# Patient Record
Sex: Female | Born: 1965 | Race: White | Hispanic: No | Marital: Married | Smoking: Former smoker
Health system: Southern US, Community
[De-identification: ages and names within clinical notes are randomized; demographics above are authoritative.]

## PROBLEM LIST (undated history)

## (undated) DIAGNOSIS — J45909 Unspecified asthma, uncomplicated: Secondary | ICD-10-CM

## (undated) DIAGNOSIS — E119 Type 2 diabetes mellitus without complications: Secondary | ICD-10-CM

## (undated) DIAGNOSIS — E282 Polycystic ovarian syndrome: Secondary | ICD-10-CM

## (undated) DIAGNOSIS — M4727 Other spondylosis with radiculopathy, lumbosacral region: Secondary | ICD-10-CM

## (undated) DIAGNOSIS — J189 Pneumonia, unspecified organism: Secondary | ICD-10-CM

## (undated) DIAGNOSIS — J069 Acute upper respiratory infection, unspecified: Secondary | ICD-10-CM

## (undated) DIAGNOSIS — K219 Gastro-esophageal reflux disease without esophagitis: Secondary | ICD-10-CM

## (undated) DIAGNOSIS — Z8719 Personal history of other diseases of the digestive system: Secondary | ICD-10-CM

## (undated) DIAGNOSIS — M7989 Other specified soft tissue disorders: Secondary | ICD-10-CM

## (undated) HISTORY — DX: Morbid (severe) obesity due to excess calories: E66.01

## (undated) HISTORY — DX: Gastro-esophageal reflux disease without esophagitis: K21.9

## (undated) HISTORY — DX: Type 2 diabetes mellitus without complications: E11.9

## (undated) HISTORY — DX: Polycystic ovarian syndrome: E28.2

## (undated) HISTORY — DX: Other spondylosis with radiculopathy, lumbosacral region: M47.27

## (undated) HISTORY — PX: TONSILLECTOMY: SUR1361

---

## 1988-10-29 HISTORY — PX: CHOLECYSTECTOMY: SHX55

## 1988-10-29 HISTORY — PX: APPENDECTOMY: SHX54

## 1989-10-29 HISTORY — PX: KNEE SURGERY: SHX244

## 1998-11-21 ENCOUNTER — Other Ambulatory Visit: Admission: RE | Admit: 1998-11-21 | Discharge: 1998-11-21 | Payer: Self-pay | Admitting: Obstetrics and Gynecology

## 2009-10-29 HISTORY — PX: EYE SURGERY: SHX253

## 2016-09-14 ENCOUNTER — Other Ambulatory Visit: Payer: Self-pay | Admitting: Internal Medicine

## 2016-09-14 DIAGNOSIS — M4726 Other spondylosis with radiculopathy, lumbar region: Secondary | ICD-10-CM

## 2016-09-28 ENCOUNTER — Ambulatory Visit
Admission: RE | Admit: 2016-09-28 | Discharge: 2016-09-28 | Disposition: A | Discharge status: Home / Self Care | Payer: Managed Care, Other (non HMO) | Source: Ambulatory Visit | Attending: Internal Medicine | Admitting: Internal Medicine

## 2016-09-28 DIAGNOSIS — M4726 Other spondylosis with radiculopathy, lumbar region: Secondary | ICD-10-CM

## 2016-11-08 ENCOUNTER — Other Ambulatory Visit: Payer: Self-pay | Admitting: Neurosurgery

## 2016-11-08 DIAGNOSIS — G959 Disease of spinal cord, unspecified: Secondary | ICD-10-CM

## 2016-12-28 ENCOUNTER — Telehealth: Payer: Self-pay | Admitting: Gynecologic Oncology

## 2016-12-28 ENCOUNTER — Encounter: Payer: Self-pay | Admitting: Gynecologic Oncology

## 2016-12-28 NOTE — Telephone Encounter (Signed)
Faxed a letter w/appt date and time for Ms. Pinkelman. Also mailed a letter to the pt.

## 2017-01-16 ENCOUNTER — Telehealth: Payer: Self-pay | Admitting: *Deleted

## 2017-01-16 NOTE — Telephone Encounter (Signed)
Patient called and left a message regarding her appt. I have called her back, patient stated " I just wanted to see if there was an earlier date appt." I have spoke with the patient and explained that April 4th is the first day Dr. Denman George is back in clinic. Patient stated that " that is fine, I will see her then. Just having some pain, able to control the pain." I told her to call the office if she has a change in pain levels. Patient stated that she understood.

## 2017-01-30 ENCOUNTER — Ambulatory Visit: Payer: Managed Care, Other (non HMO) | Attending: Gynecologic Oncology | Admitting: Gynecologic Oncology

## 2017-01-30 ENCOUNTER — Encounter: Payer: Self-pay | Admitting: Gynecologic Oncology

## 2017-01-30 VITALS — BP 153/54 | HR 73 | Temp 98.2°F | Resp 20 | Ht 58.74 in | Wt 262.3 lb

## 2017-01-30 DIAGNOSIS — E119 Type 2 diabetes mellitus without complications: Secondary | ICD-10-CM | POA: Insufficient documentation

## 2017-01-30 DIAGNOSIS — N939 Abnormal uterine and vaginal bleeding, unspecified: Secondary | ICD-10-CM

## 2017-01-30 DIAGNOSIS — E282 Polycystic ovarian syndrome: Secondary | ICD-10-CM

## 2017-01-30 DIAGNOSIS — M4727 Other spondylosis with radiculopathy, lumbosacral region: Secondary | ICD-10-CM | POA: Insufficient documentation

## 2017-01-30 DIAGNOSIS — Z87891 Personal history of nicotine dependence: Secondary | ICD-10-CM | POA: Insufficient documentation

## 2017-01-30 DIAGNOSIS — Z6841 Body Mass Index (BMI) 40.0 and over, adult: Secondary | ICD-10-CM | POA: Diagnosis not present

## 2017-01-30 DIAGNOSIS — N915 Oligomenorrhea, unspecified: Secondary | ICD-10-CM

## 2017-01-30 DIAGNOSIS — N83202 Unspecified ovarian cyst, left side: Secondary | ICD-10-CM

## 2017-01-30 DIAGNOSIS — I10 Essential (primary) hypertension: Secondary | ICD-10-CM

## 2017-01-30 DIAGNOSIS — M545 Low back pain, unspecified: Secondary | ICD-10-CM

## 2017-01-30 DIAGNOSIS — Z7951 Long term (current) use of inhaled steroids: Secondary | ICD-10-CM | POA: Insufficient documentation

## 2017-01-30 DIAGNOSIS — Z79899 Other long term (current) drug therapy: Secondary | ICD-10-CM | POA: Insufficient documentation

## 2017-01-30 DIAGNOSIS — K219 Gastro-esophageal reflux disease without esophagitis: Secondary | ICD-10-CM

## 2017-01-30 DIAGNOSIS — M1990 Unspecified osteoarthritis, unspecified site: Secondary | ICD-10-CM

## 2017-01-30 DIAGNOSIS — G8929 Other chronic pain: Secondary | ICD-10-CM

## 2017-01-30 DIAGNOSIS — F419 Anxiety disorder, unspecified: Secondary | ICD-10-CM | POA: Diagnosis not present

## 2017-01-30 DIAGNOSIS — N83292 Other ovarian cyst, left side: Secondary | ICD-10-CM | POA: Diagnosis not present

## 2017-01-30 DIAGNOSIS — Z7952 Long term (current) use of systemic steroids: Secondary | ICD-10-CM | POA: Diagnosis not present

## 2017-01-30 DIAGNOSIS — Z803 Family history of malignant neoplasm of breast: Secondary | ICD-10-CM

## 2017-01-30 DIAGNOSIS — N838 Other noninflammatory disorders of ovary, fallopian tube and broad ligament: Secondary | ICD-10-CM | POA: Insufficient documentation

## 2017-01-30 DIAGNOSIS — Z7984 Long term (current) use of oral hypoglycemic drugs: Secondary | ICD-10-CM | POA: Insufficient documentation

## 2017-01-30 DIAGNOSIS — M549 Dorsalgia, unspecified: Secondary | ICD-10-CM | POA: Insufficient documentation

## 2017-01-30 DIAGNOSIS — Z794 Long term (current) use of insulin: Secondary | ICD-10-CM

## 2017-01-30 NOTE — Progress Notes (Signed)
Consult Note: Gyn-Onc  Consult was requested by Dr. Alfonse Spruce for the evaluation of Lauren Khan 51 y.o. female  CC:  Chief Complaint  Patient presents with  . oligiomenorrhea    Assessment/Plan:  Ms. Lauren Khan  is a 51 y.o.  year old with a complex and enlarging 7cm left ovarian cyst and abnormal uterine bleeding in the setting of a strong family history of premenopausal breast cancer, and extreme morbid obesity (BMI 51), and chronic sterioid use. Her cervix is difficult to visualize in the office and therefore office endometrial sampling is not possible.  I am recommending surgical evaluation of the left adnexa. I suspect it is likely benign and am reassured by the normal CA 125, however, given its change in appearance and her family history, I believe pathologic confirmation is best. I discussed that surgery for Lauren Khan is associated with very high risk, particularly for risk of complications such as GI injury, or conversion to laparotomy, hernia formation, postoperative infection, readmission and or reoperation. Given that there is no apparent pre-existing uterine pathology, and given these very high risks, I recommend not performing elective hysterectomy at the time of surgery. However, we will perform a D&C and send for frozen section. If malignancy is found in either the left ovary or endometrium, we would proceed with hysterectomy during the surgery, as the benefits would balance the risks in that setting.  With respect to the right ovary we discussed observation versus elective salpingo-oophorectomy at the time of this surgery. I do not believe that BSO carries greater surgical risk for Lauren Khan than USO. I discussed that there is an increased all cause mortality associated with elective BSO in women <65 years. However, given her abnormal uterine bleeding, surgical menopause may alleviate her symptoms sooner. It would, however, be associated with abrupt onset of menopausal symptoms. Benefits would  also include prevention of ovarian cancer risk. After discussing these complex benefits and risks of performing BSO (as opposed to USO), Lauren Khan has elected to proceed with BSO at the time of surgery.   We discussed that a minimally invasive route (robotic assisted BSO) will be performed to avoid laparotomy which is associated with >50% risk for wound separation in obese diabetic patients. However, if she has severe adhesive disease from prior surgeries, or other complications are encountered, laparotomy may be necessary.   We discussed anticipated hospital stay and discharge instructions/restrictions.  Surgery, (D&C, robotic assisted BSO, possible hysterectomy) was scheduled for April 19th. We will check HbA1C as part of preop labs.  Due to her chronic hx of 25m q day of prednisone use for her arthritis, she is at increased risk for wound healing and HPA axis failure. At this dose she is at intermediate risk for HPA axis failure, and given the intermediate complexity of the procedure, should be given an empiric dosing of stress dose steroids. We will administer 50 mg hydrocortisone intravenously just before the procedure and 25 mg of hydrocortisone every eight hours for 24 hours. She will resume usual dose thereafter.  HPI: Lauren Beumeris a 51year old G0 who is seen in consultation at the request of Dr NAlfonse Sprucefor a complex left ovarian cyst in the setting of morbid obesity (BMI 51kg/m2), diabetes mellitus and prior abdominal surgeries.  JJayleahwas having an MRI of the back performed in December, 2017 for back pain work up. A left adnexal cyst was identified at that time. She was then seen by Dr NAlfonse Sprucewho performed a TVUS on 10/19/16 which identified  a uterus measuring 8.1x4.3x3.4cm with a left ovary containing a 6.7cm thin walled complex mass with a posterior mass measuring 2.8cm within it. CA 125 at that time was normal at 8.  A repeat US was performed on 12/21/16 and showed a uterus measuring  10.7x3.8x3.1cm, with an endometrial stripe measuring 64m. The left adnexal mass now measured 7.9cm with a hyperechoic posterior mass measuring 4.1cm.  The patient has no pelvic symptoms related to the cyst. However, she does report lifelong annovulatory bleeding concerns (light), and PCOS. She has a history of taking oral progestin for withdrawal bleeds.She has not yet tried a progestin releasing IUD. Her last pap in December, 2017 was ASCUS but negative for high risk HPV subtypes. She is nulliparous and with a history of primary infertility.  The patient has had a prior laparotomy for cholecystectomy and appendectomy (not ruptured) in 1990.   She has a family history significant for a maternal grandmother with breast cancer in her 42's(died), a maternal cousin who died of breast cancer at age 5939and a maternal aunt with breast cancer in her 431's The patient is unaware if any were tested for BRCA deleterious mutations. There is no family history for ovarian cancer.  Current Meds:  Outpatient Encounter Prescriptions as of 01/30/2017  Medication Sig  . Albuterol Sulfate 108 (90 Base) MCG/ACT AEPB Inhale into the lungs.  . ALPRAZolam (XANAX) 0.5 MG tablet Take 0.5 mg by mouth.  . B Complex Vitamins (VITAMIN B COMPLEX PO) Take by mouth.  . citalopram (CELEXA) 40 MG tablet TAKE ONE TABLET BY MOUTH EVERY DAY  . fluticasone (FLONASE) 50 MCG/ACT nasal spray USE TWO SPRAYS IN EACH NOSTRIL EVERY DAY  . furosemide (LASIX) 80 MG tablet TAKE ONE TABLET BY MOUTH DAILY AS NEEDED  . glipiZIDE (GLUCOTROL) 5 MG tablet TAKE ONE TABLET TWICE DAILY  . metFORMIN (GLUCOPHAGE) 1000 MG tablet Take 2,000 mg by mouth at bedtime.  .Marland Kitchennystatin cream (MYCOSTATIN) apply to affected area two times daily  . omeprazole (PRILOSEC) 20 MG capsule TAKE ONE CAPSULE TWICE DAILY  . predniSONE (DELTASONE) 10 MG tablet TAKE 4 TABLETS BY MOUTH FOR 2 DAYS, TAKE THREE TABLETS FOR 2 DAYS, TAKE TWO TABLETS FOR 2 DAYS, TAKE ONE TABLET FOR 2  DAYS   No facility-administered encounter medications on file as of 01/30/2017.     Allergy: Not on File  Social Hx:   Social History   Social History  . Marital status: Married    Spouse name: N/A  . Number of children: N/A  . Years of education: N/A   Occupational History  . Not on file.   Social History Main Topics  . Smoking status: Former Smoker    Packs/day: 1.00    Years: 5.00    Types: Cigarettes    Quit date: 09/16/1990  . Smokeless tobacco: Never Used  . Alcohol use No  . Drug use: No  . Sexual activity: Not Currently   Other Topics Concern  . Not on file   Social History Narrative  . No narrative on file    Past Surgical Hx:  Past Surgical History:  Procedure Laterality Date  . APPENDECTOMY    . CHOLECYSTECTOMY     open  . KNEE SURGERY    . TONSILLECTOMY      Past Medical Hx:  Past Medical History:  Diagnosis Date  . Anxiety   . [place]   . GERD (gastroesophageal reflux disease)   . Hypertension   .  Lumbosacral spondylosis with radiculopathy   . Morbid obesity ([place])   . PCOS (polycystic ovarian syndrome)     Past Gynecological History:  Irregular bleeding No LMP recorded.  Family Hx:  Family History  Problem Relation Age of Onset  . Cancer Maternal Aunt   . Cancer Maternal Grandmother   . Cancer Cousin     Review of Systems:  Constitutional  Feels well,    ENT Normal appearing ears and nares bilaterally Skin/Breast  No rash, sores, jaundice, itching, dryness Cardiovascular  No chest pain, shortness of breath, or edema  Pulmonary  No cough or wheeze.  Gastro Intestinal  No nausea, vomitting, or diarrhoea. No bright red blood per rectum, no abdominal pain, change in bowel movement, or constipation.  Genito Urinary  No frequency, urgency, dysuria, + abnormal uterine bleeding Musculo Skeletal  + back pain Neurologic  No weakness, numbness, change in gait,  Psychology  No depression,  anxiety, insomnia.   Vitals:  Blood pressure (!) 153/54, pulse 73, temperature 98.2 F (36.8 C), temperature source Oral, resp. rate 20, height 4' 10.74" (1.492 m), weight 262 lb 4.8 oz (119 kg).  Physical Exam: WD in NAD Neck  Supple NROM, without any enlargements.  Lymph Node Survey No cervical supraclavicular or inguinal adenopathy Cardiovascular  Pulse normal rate, regularity and rhythm. S1 and S2 normal.  Lungs  Clear to auscultation bilateraly, without wheezes/crackles/rhonchi. Good air movement.  Skin  No rash/lesions/breakdown  Psychiatry  Alert and oriented to person, place, and time  Abdomen  Normoactive bowel sounds, abdomen soft, non-tender and very obese rotund abdomen with pannus, without evidence of hernia.  Back No CVA tenderness Genito Urinary  Vulva/vagina: Normal external female genitalia.  No lesions. No discharge or bleeding.  Bladder/urethra:  No lesions or masses, well supported bladder  Vagina: long, normal, narrow  Cervix: Normal to palpate, but not visualized on speculum exam  Uterus:  Small, mobile, no parametrial involvement or nodularity.  Adnexa: no palpable masses. Rectal  deferred Extremities  No bilateral cyanosis, clubbing or edema.   [name], MD  01/31/2017, 11:40 AM

## 2017-01-30 NOTE — Patient Instructions (Signed)
Preparing for your Surgery  Plan for surgery on February 14, 2017 with Dr. Denman George.  Planned procedure is dilation and curettage, bilateral robotic salpingo-oophorectomy possible hysterotomy.   Pre-operative Testing -You will receive a phone call from presurgical testing at [facility] to arrange for a pre-operative testing appointment before your surgery.  This appointment normally occurs one to two weeks before your scheduled surgery.   -Bring your insurance card, copy of an advanced directive if applicable, medication list  -At that visit, you will be asked to sign a consent for a possible blood transfusion in case a transfusion becomes necessary during surgery.  The need for a blood transfusion is rare but having consent is a necessary part of your care.     -You should not be taking blood thinners or aspirin at least ten days prior to surgery unless instructed by your surgeon.  Day Before Surgery at [place] will be asked to take in a light diet the day before surgery.  Avoid carbonated beverages.  You will be advised to have nothing to eat or drink after midnight the evening before.     Eat a light diet the day before surgery.  Examples including soups, broths,  toast, yogurt, mashed potatoes.  Things to avoid include carbonated beverages  (fizzy beverages), raw fruits and raw vegetables, or beans.    If your bowels are filled with gas, your surgeon will have difficulty  visualizing your pelvic organs which increases your surgical risks.  Your role in recovery Your role is to become active as soon as directed by your doctor, while still giving yourself time to heal.  Rest when you feel tired. You will be asked to do the following in order to speed your recovery:  - Cough and breathe deeply. This helps toclear and expand your lungs and can prevent pneumonia. You may be given a spirometer to practice deep breathing. A staff member will show you how to use the  spirometer. - Do mild physical activity. Walking or moving your legs help your circulation and body functions return to normal. A staff member will help you when you try to walk and will provide you with simple exercises. Do not try to get up or walk alone the first time. - Actively manage your pain. Managing your pain lets you move in comfort. We will ask you to rate your pain on a scale of zero to 10. It is your responsibility to tell your doctor or nurse where and how much you hurt so your pain can be treated.  Special Considerations -If you are diabetic, you may be placed on insulin after surgery to have closer control over your blood sugars to promote healing and recovery.  This does not mean that you will be discharged on insulin.  If applicable, your oral antidiabetics will be resumed when you are tolerating a solid diet.  -Your final pathology results from surgery should be available by the Friday after surgery and the results will be relayed to you when available.  Eat a light diet the day before surgery.  Examples including soups, broths, toast, yogurt, mashed potatoes.  Things to avoid include carbonated beverages (fizzy beverages), raw fruits and raw vegetables, or beans.   If your bowels are filled with gas, your surgeon will have difficulty visualizing your pelvic organs which increases your surgical risks. Blood Transfusion Information WHAT IS A BLOOD TRANSFUSION? A transfusion is the replacement of blood or some of its parts. Blood is made  up of multiple cells which provide different functions.  Red blood cells carry oxygen and are used for blood loss replacement.  White blood cells fight against infection.  Platelets control bleeding.  Plasma helps clot blood.  Other blood products are available for specialized needs, such as hemophilia or other clotting disorders. BEFORE THE TRANSFUSION  Who gives blood for transfusions?   You may be able to donate blood to be used at a  later date on yourself (autologous donation).  Relatives can be asked to donate blood. This is generally not any safer than if you have received blood from a stranger. The same precautions are taken to ensure safety when a relative's blood is donated.  Healthy volunteers who are fully evaluated to make sure their blood is safe. This is blood bank blood. Transfusion therapy is the safest it has ever been in the practice of medicine. Before blood is taken from a donor, a complete history is taken to make sure that person has no history of diseases nor engages in risky social behavior (examples are intravenous drug use or sexual activity with multiple partners). The donor's travel history is screened to minimize risk of transmitting infections, such as malaria. The donated blood is tested for signs of infectious diseases, such as HIV and hepatitis. The blood is then tested to be sure it is compatible with you in order to minimize the chance of a transfusion reaction. If you or a relative donates blood, this is often done in anticipation of surgery and is not appropriate for emergency situations. It takes many days to process the donated blood. RISKS AND COMPLICATIONS Although transfusion therapy is very safe and saves many lives, the main dangers of transfusion include:   Getting an infectious disease.  Developing a transfusion reaction. This is an allergic reaction to something in the blood you were given. Every precaution is taken to prevent this. The decision to have a blood transfusion has been considered carefully by your caregiver before blood is given. Blood is not given unless the benefits outweigh the risks.

## 2017-01-31 ENCOUNTER — Encounter: Payer: Self-pay | Admitting: Gynecologic Oncology

## 2017-02-04 NOTE — Patient Instructions (Addendum)
Lauren Khan  02/04/2017   Your procedure is scheduled on: 02-14-17  Report to [facility] Main  Entrance [place]  elevators to 3rd floor to  [place] at 930  AM.  Call this number if you have problems the morning of surgery [phone]   Remember: ONLY 1 PERSON MAY GO WITH YOU TO SHORT STAY TO GET  READY MORNING OF [place].  Do not eat food or drink liquids :After Midnight.FOLLOW LIGHT DIET DAY BEFORE SURGERY ON 02-13-17 SEE INSTRUCTIONS BELOW     Take these medicines the morning of surgery with A SIP OF WATER: ALBUTEROL INHALER IF NEEDED AND BRING INHALER, ALPRAZOLAM IF NEEDED, CITALOPRAM (CELEXA), PREDISONE IF NEEDED, FLONASE NASAL SPRAY IF NEEDED,  LEVOFLOXIN DO NOT TAKE ANY DIABETIC MEDICATIONS DAY OF YOUR SURGERY                               You may not have any metal on your body including hair pins and              piercings  Do not wear jewelry, make-up, lotions, powders or perfumes, deodorant             Do not wear nail polish.  Do not shave  48 hours prior to surgery.              Men may shave face and neck.   Do not bring valuables to the hospital. [facility].  Contacts, dentures or bridgework may not be worn into surgery.  Leave suitcase in the car. After surgery it may be brought to your room.      _____________________________________________________________________             Eat a light diet the day before surgery.  Examples including soups, broths, toast, yogurt, mashed potatoes.  Things to avoid include carbonated beverages (fizzy beverages), raw fruits and raw vegetables, or beans.   If your bowels are filled with gas, your surgeon will have difficulty visualizing your pelvic organs which increases your surgical risks.  How to Manage Your Diabetes Before and After Surgery  Why is it important to control my blood sugar before and after surgery? . Improving blood sugar  levels before and after surgery helps healing and can limit problems. . A way of improving blood sugar control is eating a healthy diet by: o  Eating less sugar and carbohydrates o  Increasing activity/exercise o  Talking with your doctor about reaching your blood sugar goals . High blood sugars (greater than 180 mg/dL) can raise your risk of infections and slow your recovery, so you will need to focus on controlling your diabetes during the weeks before surgery. . Make sure that the doctor who takes care of your diabetes knows about your planned surgery including the date and location.  How do I manage my blood sugar before surgery? . Check your blood sugar at least 4 times a day, starting 2 days before surgery, to make sure that the level is not too high or low. o Check your blood sugar the morning of your surgery when you wake up and every 2 hours until you get to the Short Stay unit. . If your blood sugar is less  than 70 mg/dL, you will need to treat for low blood sugar: o Do not take insulin. o Treat a low blood sugar (less than 70 mg/dL) with  cup of clear juice (cranberry or apple), 4 glucose tablets, OR glucose gel. o Recheck blood sugar in 15 minutes after treatment (to make sure it is greater than 70 mg/dL). If your blood sugar is not greater than 70 mg/dL on recheck, call [phone] for further instructions. . Report your blood sugar to the short stay nurse when you get to Short Stay.  . If you are admitted to the hospital after surgery: o Your blood sugar will be checked by the staff and you will probably be given insulin after surgery (instead of oral diabetes medicines) to make sure you have good blood sugar levels. o The goal for blood sugar control after surgery is 80-180 mg/dL.   WHAT DO I DO ABOUT MY DIABETES MEDICATION? You may take morning or lunch doses of glipizide day before surgery 02-13-17  Do not take glipizide day of surgery 02-14-17 You may take metformin day  before 02-13-17  surgery  Do not take metformin day of surgery 02-14-17  .  Patient Signature:  Date:   Nurse Signature:  Date:   Reviewed and Endorsed by [facility] Patient Education Committee, August 2015  [facility] - Preparing for Surgery Before surgery, you can play an important role.  Because skin is not sterile, your skin needs to be as free of germs as possible.  You can reduce the number of germs on your skin by washing with CHG (chlorahexidine gluconate) soap before surgery.  CHG is an antiseptic cleaner which kills germs and bonds with the skin to continue killing germs even after washing. Please DO NOT use if you have an allergy to CHG or antibacterial soaps.  If your skin becomes reddened/irritated stop using the CHG and inform your nurse when you arrive at Short Stay. Do not shave (including legs and underarms) for at least 48 hours prior to the first CHG shower.  You may shave your face/neck. Please follow these instructions carefully:  1.  Shower with CHG Soap the night before surgery and the  morning of Surgery.  2.  If you choose to wash your hair, wash your hair first as usual with your  normal  shampoo.  3.  After you shampoo, rinse your hair and body thoroughly to remove the  shampoo.                           4.  Use CHG as you would any other liquid soap.  You can apply chg directly  to the skin and wash                       Gently with a scrungie or clean washcloth.  5.  Apply the CHG Soap to your body ONLY FROM THE NECK DOWN.   Do not use on face/ open                           Wound or open sores. Avoid contact with eyes, ears mouth and genitals (private parts).                       Wash face,  Genitals (private parts) with your normal soap.  6.  Wash thoroughly, paying special attention to the area where your surgery  will be performed.  7.  Thoroughly rinse your body with warm water from the neck down.  8.  DO NOT shower/wash with your normal soap  after using and rinsing off  the CHG Soap.                9.  Pat yourself dry with a clean towel.            10.  Wear clean pajamas.            11.  Place clean sheets on your bed the night of your first shower and do not  sleep with pets. Day of Surgery : Do not apply any lotions/deodorants the morning of surgery.  Please wear clean clothes to the hospital/surgery center.  ________________________________________________________________________   Incentive Spirometer  An incentive spirometer is a tool that can help keep your lungs clear and active. This tool measures how well you are filling your lungs with each breath. Taking long deep breaths may help reverse or decrease the chance of developing breathing (pulmonary) problems (especially infection) following:  A long period of time when you are unable to move or be active. BEFORE THE PROCEDURE   If the spirometer includes an indicator to show your best effort, your nurse or respiratory therapist will set it to a desired goal.  If possible, sit up straight or lean slightly forward. Try not to slouch.  Hold the incentive spirometer in an upright position. INSTRUCTIONS FOR USE  1. Sit on the edge of your bed if possible, or sit up as far as you can in bed or on a chair. 2. Hold the incentive spirometer in an upright position. 3. Breathe out normally. 4. Place the mouthpiece in your mouth and seal your lips tightly around it. 5. Breathe in slowly and as deeply as possible, raising the piston or the ball toward the top of the column. 6. Hold your breath for 3-5 seconds or for as long as possible. Allow the piston or ball to fall to the bottom of the column. 7. Remove the mouthpiece from your mouth and breathe out normally. 8. Rest for a few seconds and repeat Steps 1 through 7 at least 10 times every 1-2 hours when you are awake. Take your time and take a few normal breaths between deep breaths. 9. The spirometer may include an  indicator to show your best effort. Use the indicator as a goal to work toward during each repetition. 10. After each set of 10 deep breaths, practice coughing to be sure your lungs are clear. If you have an incision (the cut made at the time of surgery), support your incision when coughing by placing a pillow or rolled up towels firmly against it. Once you are able to get out of bed, walk around indoors and cough well. You may stop using the incentive spirometer when instructed by your caregiver.  RISKS AND COMPLICATIONS  Take your time so you do not get dizzy or light-headed.  If you are in pain, you may need to take or ask for pain medication before doing incentive spirometry. It is harder to take a deep breath if you are having pain. AFTER USE  Rest and breathe slowly and easily.  It can be helpful to keep track of a log of your progress. Your caregiver can provide you with a simple table to help with this. If you are using  the spirometer at home, follow these instructions: [place] IF:   You are having difficultly using the spirometer.  You have trouble using the spirometer as often as instructed.  Your pain medication is not giving enough relief while using the spirometer.  You develop fever of 100.5 F (38.1 C) or higher. SEEK IMMEDIATE MEDICAL CARE IF:   You cough up bloody sputum that had not been present before.  You develop fever of 102 F (38.9 C) or greater.  You develop worsening pain at or near the incision site. MAKE SURE YOU:   Understand these instructions.  Will watch your condition.  Will get help right away if you are not doing well or get worse. Document Released: 02/25/2007 Document Revised: 01/07/2012 Document Reviewed: 04/28/2007 Exit[employer] Patient Information 2014 [employer], [place].   ________________________________________________________________________  WHAT IS A BLOOD TRANSFUSION? Blood Transfusion Information  A transfusion is the  replacement of blood or some of its parts. Blood is made up of multiple cells which provide different functions.  Red blood cells carry oxygen and are used for blood loss replacement.  White blood cells fight against infection.  Platelets control bleeding.  Plasma helps clot blood.  Other blood products are available for specialized needs, such as hemophilia or other clotting disorders. BEFORE THE TRANSFUSION  Who gives blood for transfusions?   Healthy volunteers who are fully evaluated to make sure their blood is safe. This is blood bank blood. Transfusion therapy is the safest it has ever been in the practice of medicine. Before blood is taken from a donor, a complete history is taken to make sure that person has no history of diseases nor engages in risky social behavior (examples are intravenous drug use or sexual activity with multiple partners). The donor's travel history is screened to minimize risk of transmitting infections, such as malaria. The donated blood is tested for signs of infectious diseases, such as HIV and hepatitis. The blood is then tested to be sure it is compatible with you in order to minimize the chance of a transfusion reaction. If you or a relative donates blood, this is often done in anticipation of surgery and is not appropriate for emergency situations. It takes many days to process the donated blood. RISKS AND COMPLICATIONS Although transfusion therapy is very safe and saves many lives, the main dangers of transfusion include:   Getting an infectious disease.  Developing a transfusion reaction. This is an allergic reaction to something in the blood you were given. Every precaution is taken to prevent this. The decision to have a blood transfusion has been considered carefully by your caregiver before blood is given. Blood is not given unless the benefits outweigh the risks. AFTER THE TRANSFUSION  Right after receiving a blood transfusion, you will usually  feel much better and more energetic. This is especially true if your red blood cells have gotten low (anemic). The transfusion raises the level of the red blood cells which carry oxygen, and this usually causes an energy increase.  The nurse administering the transfusion will monitor you carefully for complications. HOME CARE INSTRUCTIONS  No special instructions are needed after a transfusion. You may find your energy is better. Speak with your caregiver about any limitations on activity for underlying diseases you may have. SEEK MEDICAL CARE IF:   Your condition is not improving after your transfusion.  You develop redness or irritation at the intravenous (IV) site. SEEK IMMEDIATE MEDICAL CARE IF:  Any of the following symptoms occur  over the next 12 hours:  Shaking chills.  You have a temperature by mouth above 102 F (38.9 C), not controlled by medicine.  Chest, back, or muscle pain.  People around you feel you are not acting correctly or are confused.  Shortness of breath or difficulty breathing.  Dizziness and fainting.  You get a rash or develop hives.  You have a decrease in urine output.  Your urine turns a dark color or changes to pink, red, or brown. Any of the following symptoms occur over the next 10 days:  You have a temperature by mouth above 102 F (38.9 C), not controlled by medicine.  Shortness of breath.  Weakness after normal activity.  The white part of the eye turns yellow (jaundice).  You have a decrease in the amount of urine or are urinating less often.  Your urine turns a dark color or changes to pink, red, or brown. Document Released: 10/12/2000 Document Revised: 01/07/2012 Document Reviewed: 05/31/2008 [facility] Patient Information 2014 [place], [place].  _______________________________________________________________________

## 2017-02-08 ENCOUNTER — Other Ambulatory Visit: Payer: Self-pay

## 2017-02-08 ENCOUNTER — Encounter (HOSPITAL_COMMUNITY): Payer: Self-pay

## 2017-02-08 ENCOUNTER — Encounter (HOSPITAL_COMMUNITY)
Admission: RE | Admit: 2017-02-08 | Discharge: 2017-02-08 | Disposition: A | Discharge status: Home / Self Care | Payer: Managed Care, Other (non HMO) | Source: Ambulatory Visit | Attending: Gynecologic Oncology | Admitting: Gynecologic Oncology

## 2017-02-08 DIAGNOSIS — Z01818 Encounter for other preprocedural examination: Secondary | ICD-10-CM | POA: Insufficient documentation

## 2017-02-08 DIAGNOSIS — Z01812 Encounter for preprocedural laboratory examination: Secondary | ICD-10-CM | POA: Insufficient documentation

## 2017-02-08 DIAGNOSIS — Z0183 Encounter for blood typing: Secondary | ICD-10-CM | POA: Diagnosis not present

## 2017-02-08 DIAGNOSIS — N915 Oligomenorrhea, unspecified: Secondary | ICD-10-CM

## 2017-02-08 DIAGNOSIS — M545 Low back pain, unspecified: Secondary | ICD-10-CM

## 2017-02-08 DIAGNOSIS — G8929 Other chronic pain: Secondary | ICD-10-CM

## 2017-02-08 DIAGNOSIS — Z6841 Body Mass Index (BMI) 40.0 and over, adult: Secondary | ICD-10-CM

## 2017-02-08 DIAGNOSIS — Z794 Long term (current) use of insulin: Secondary | ICD-10-CM

## 2017-02-08 DIAGNOSIS — R9431 Abnormal electrocardiogram [ECG] [EKG]: Secondary | ICD-10-CM | POA: Insufficient documentation

## 2017-02-08 DIAGNOSIS — Z803 Family history of malignant neoplasm of breast: Secondary | ICD-10-CM

## 2017-02-08 DIAGNOSIS — K219 Gastro-esophageal reflux disease without esophagitis: Secondary | ICD-10-CM

## 2017-02-08 DIAGNOSIS — E119 Type 2 diabetes mellitus without complications: Secondary | ICD-10-CM | POA: Insufficient documentation

## 2017-02-08 DIAGNOSIS — E282 Polycystic ovarian syndrome: Secondary | ICD-10-CM

## 2017-02-08 DIAGNOSIS — I10 Essential (primary) hypertension: Secondary | ICD-10-CM

## 2017-02-08 DIAGNOSIS — N838 Other noninflammatory disorders of ovary, fallopian tube and broad ligament: Secondary | ICD-10-CM

## 2017-02-08 DIAGNOSIS — N939 Abnormal uterine and vaginal bleeding, unspecified: Secondary | ICD-10-CM

## 2017-02-08 HISTORY — DX: Acute upper respiratory infection, unspecified: J06.9

## 2017-02-08 HISTORY — DX: Personal history of other diseases of the digestive system: Z87.19

## 2017-02-08 HISTORY — DX: Other specified soft tissue disorders: M79.89

## 2017-02-08 HISTORY — DX: Unspecified asthma, uncomplicated: J45.909

## 2017-02-08 HISTORY — DX: Pneumonia, unspecified organism: J18.9

## 2017-02-08 LAB — COMPREHENSIVE METABOLIC PANEL
ALK PHOS: 68 U/L (ref 38–126)
ALT: 22 U/L (ref 14–54)
ANION GAP: 8 (ref 5–15)
AST: 21 U/L (ref 15–41)
Albumin: 3.5 g/dL (ref 3.5–5.0)
BUN: 16 mg/dL (ref 6–20)
CHLORIDE: 104 mmol/L (ref 101–111)
CO2: 27 mmol/L (ref 22–32)
Calcium: 9 mg/dL (ref 8.9–10.3)
Creatinine, Ser: 0.49 mg/dL (ref 0.44–1.00)
Glucose, Bld: 92 mg/dL (ref 65–99)
POTASSIUM: 3.8 mmol/L (ref 3.5–5.1)
Sodium: 139 mmol/L (ref 135–145)
Total Bilirubin: 0.5 mg/dL (ref 0.3–1.2)
Total Protein: 7.5 g/dL (ref 6.5–8.1)

## 2017-02-08 LAB — CBC WITH DIFFERENTIAL/PLATELET
BASOS ABS: 0 10*3/uL (ref 0.0–0.1)
Basophils Relative: 0 %
EOS PCT: 1 %
Eosinophils Absolute: 0.2 10*3/uL (ref 0.0–0.7)
HCT: 40.3 % (ref 36.0–46.0)
HEMOGLOBIN: 12.6 g/dL (ref 12.0–15.0)
Lymphocytes Relative: 20 %
Lymphs Abs: 3.4 10*3/uL (ref 0.7–4.0)
MCH: 25.1 pg — ABNORMAL LOW (ref 26.0–34.0)
MCHC: 31.3 g/dL (ref 30.0–36.0)
MCV: 80.4 fL (ref 78.0–100.0)
Monocytes Absolute: 1.4 10*3/uL — ABNORMAL HIGH (ref 0.1–1.0)
Monocytes Relative: 8 %
NEUTROS ABS: 12.1 10*3/uL — AB (ref 1.7–7.7)
NEUTROS PCT: 71 %
PLATELETS: 274 10*3/uL (ref 150–400)
RBC: 5.01 MIL/uL (ref 3.87–5.11)
RDW: 16.1 % — ABNORMAL HIGH (ref 11.5–15.5)
WBC: 17.1 10*3/uL — AB (ref 4.0–10.5)

## 2017-02-08 LAB — URINALYSIS, ROUTINE W REFLEX MICROSCOPIC
BILIRUBIN URINE: NEGATIVE
Glucose, UA: NEGATIVE mg/dL
Hgb urine dipstick: NEGATIVE
Ketones, ur: 5 mg/dL — AB
Leukocytes, UA: NEGATIVE
Nitrite: NEGATIVE
PROTEIN: NEGATIVE mg/dL
Specific Gravity, Urine: 1.028 (ref 1.005–1.030)
pH: 5 (ref 5.0–8.0)

## 2017-02-08 LAB — HCG, SERUM, QUALITATIVE: PREG SERUM: NEGATIVE

## 2017-02-08 LAB — ABO/RH: ABO/RH(D): O POS

## 2017-02-08 LAB — GLUCOSE, CAPILLARY: GLUCOSE-CAPILLARY: 99 mg/dL (ref 65–99)

## 2017-02-08 NOTE — Progress Notes (Signed)
Spoke with lorri shutt rn and dr Everitt Amber aware of pts elevated white blood cell count order and dr Denman George wants cbc with dif drawn am of surgery 02-14-17, order placed in epic. Dr Denman George also wants pt notified to call dr Denman George if she gets febrile or condition worsens and to notify her primary md also. Left message for pt to call Francisco Ostrovsky rn.

## 2017-02-08 NOTE — Progress Notes (Signed)
SPOKE WITH LOIUSE DR ROSSI NURSE AND MADE AWARE OF ELEVATED WHITE COUNT ON CBC AND PT ON LEVAQUIN 750 DAILY X 10 DAYS FOR UPPER RESPIRATORY INFECTION STARTED ON 02-05-17 . MADE LOUISE AWARE RESULTS ROUTED TO DR Annamaria Helling IN EPIC ALSO.

## 2017-02-09 LAB — HEMOGLOBIN A1C
HEMOGLOBIN A1C: 6.2 % — AB (ref 4.8–5.6)
Mean Plasma Glucose: 131 mg/dL

## 2017-02-11 NOTE — Progress Notes (Addendum)
Spoke with patient  by phone  and patient is aware if she develops any fever or feels bad  or develops any cough.  she is to let dr Denman George and her primary md know and patient is ware blood work will be drawn am of surgery on 02-14-17.

## 2017-02-14 ENCOUNTER — Inpatient Hospital Stay (HOSPITAL_COMMUNITY): Payer: Managed Care, Other (non HMO) | Admitting: Registered Nurse

## 2017-02-14 ENCOUNTER — Encounter (HOSPITAL_COMMUNITY): Payer: Self-pay | Admitting: Gynecologic Oncology

## 2017-02-14 ENCOUNTER — Encounter (HOSPITAL_COMMUNITY)
Admission: RE | Disposition: A | Discharge status: Home / Self Care | Payer: Self-pay | Source: Ambulatory Visit | Attending: Gynecologic Oncology

## 2017-02-14 ENCOUNTER — Ambulatory Visit (HOSPITAL_COMMUNITY)
Admission: RE | Admit: 2017-02-14 | Discharge: 2017-02-14 | Disposition: A | Discharge status: Home / Self Care | Payer: Managed Care, Other (non HMO) | Source: Ambulatory Visit | Attending: Gynecologic Oncology | Admitting: Gynecologic Oncology

## 2017-02-14 DIAGNOSIS — Z87891 Personal history of nicotine dependence: Secondary | ICD-10-CM | POA: Diagnosis not present

## 2017-02-14 DIAGNOSIS — M1990 Unspecified osteoarthritis, unspecified site: Secondary | ICD-10-CM | POA: Diagnosis not present

## 2017-02-14 DIAGNOSIS — Z79899 Other long term (current) drug therapy: Secondary | ICD-10-CM | POA: Insufficient documentation

## 2017-02-14 DIAGNOSIS — Z6841 Body Mass Index (BMI) 40.0 and over, adult: Secondary | ICD-10-CM | POA: Insufficient documentation

## 2017-02-14 DIAGNOSIS — J45909 Unspecified asthma, uncomplicated: Secondary | ICD-10-CM | POA: Insufficient documentation

## 2017-02-14 DIAGNOSIS — K219 Gastro-esophageal reflux disease without esophagitis: Secondary | ICD-10-CM | POA: Insufficient documentation

## 2017-02-14 DIAGNOSIS — G8929 Other chronic pain: Secondary | ICD-10-CM

## 2017-02-14 DIAGNOSIS — Z7951 Long term (current) use of inhaled steroids: Secondary | ICD-10-CM | POA: Diagnosis not present

## 2017-02-14 DIAGNOSIS — N838 Other noninflammatory disorders of ovary, fallopian tube and broad ligament: Secondary | ICD-10-CM

## 2017-02-14 DIAGNOSIS — Z7952 Long term (current) use of systemic steroids: Secondary | ICD-10-CM | POA: Insufficient documentation

## 2017-02-14 DIAGNOSIS — M545 Low back pain: Secondary | ICD-10-CM

## 2017-02-14 DIAGNOSIS — I10 Essential (primary) hypertension: Secondary | ICD-10-CM | POA: Diagnosis not present

## 2017-02-14 DIAGNOSIS — Z7984 Long term (current) use of oral hypoglycemic drugs: Secondary | ICD-10-CM | POA: Insufficient documentation

## 2017-02-14 DIAGNOSIS — E282 Polycystic ovarian syndrome: Secondary | ICD-10-CM | POA: Diagnosis not present

## 2017-02-14 DIAGNOSIS — N939 Abnormal uterine and vaginal bleeding, unspecified: Secondary | ICD-10-CM | POA: Insufficient documentation

## 2017-02-14 DIAGNOSIS — N83202 Unspecified ovarian cyst, left side: Secondary | ICD-10-CM | POA: Insufficient documentation

## 2017-02-14 DIAGNOSIS — E119 Type 2 diabetes mellitus without complications: Secondary | ICD-10-CM | POA: Diagnosis not present

## 2017-02-14 DIAGNOSIS — Z794 Long term (current) use of insulin: Secondary | ICD-10-CM

## 2017-02-14 HISTORY — PX: DILATION AND CURETTAGE OF UTERUS: SHX78

## 2017-02-14 HISTORY — PX: ROBOTIC ASSISTED TOTAL HYSTERECTOMY WITH BILATERAL SALPINGO OOPHERECTOMY: SHX6086

## 2017-02-14 LAB — GLUCOSE, CAPILLARY
GLUCOSE-CAPILLARY: 98 mg/dL (ref 65–99)
Glucose-Capillary: 174 mg/dL — ABNORMAL HIGH (ref 65–99)

## 2017-02-14 LAB — DIFFERENTIAL
BASOS ABS: 0 10*3/uL (ref 0.0–0.1)
Basophils Relative: 0 %
EOS ABS: 0.1 10*3/uL (ref 0.0–0.7)
EOS PCT: 1 %
LYMPHS ABS: 3.2 10*3/uL (ref 0.7–4.0)
Lymphocytes Relative: 20 %
Monocytes Absolute: 1.2 10*3/uL — ABNORMAL HIGH (ref 0.1–1.0)
Monocytes Relative: 7 %
Neutro Abs: 11.9 10*3/uL — ABNORMAL HIGH (ref 1.7–7.7)
Neutrophils Relative %: 72 %

## 2017-02-14 LAB — CBC
HEMATOCRIT: 38.2 % (ref 36.0–46.0)
HEMOGLOBIN: 12.4 g/dL (ref 12.0–15.0)
MCH: 25.3 pg — AB (ref 26.0–34.0)
MCHC: 32.5 g/dL (ref 30.0–36.0)
MCV: 78 fL (ref 78.0–100.0)
Platelets: 274 10*3/uL (ref 150–400)
RBC: 4.9 MIL/uL (ref 3.87–5.11)
RDW: 15.9 % — ABNORMAL HIGH (ref 11.5–15.5)
WBC: 16.5 10*3/uL — ABNORMAL HIGH (ref 4.0–10.5)

## 2017-02-14 LAB — TYPE AND SCREEN
ABO/RH(D): O POS
ANTIBODY SCREEN: NEGATIVE

## 2017-02-14 SURGERY — HYSTERECTOMY, TOTAL, ROBOT-ASSISTED, LAPAROSCOPIC, WITH BILATERAL SALPINGO-OOPHORECTOMY
Anesthesia: General

## 2017-02-14 MED ORDER — ACETAMINOPHEN 500 MG PO TABS: 1000.0000 mg | ORAL_TABLET | Freq: Four times a day (QID) | ORAL | Status: DC

## 2017-02-14 MED ORDER — SUGAMMADEX SODIUM 200 MG/2ML IV SOLN
INTRAVENOUS | Status: DC | PRN
  Administered 2017-02-14: 500 mg via INTRAVENOUS

## 2017-02-14 MED ORDER — SUCCINYLCHOLINE CHLORIDE 200 MG/10ML IV SOSY
PREFILLED_SYRINGE | INTRAVENOUS | Status: DC | PRN
  Administered 2017-02-14: 120 mg via INTRAVENOUS

## 2017-02-14 MED ORDER — ROCURONIUM BROMIDE 50 MG/5ML IV SOSY
PREFILLED_SYRINGE | INTRAVENOUS | Status: AC
  Filled 2017-02-14: qty 10

## 2017-02-14 MED ORDER — KETOROLAC TROMETHAMINE 30 MG/ML IJ SOLN
INTRAMUSCULAR | Status: AC
  Filled 2017-02-14: qty 1

## 2017-02-14 MED ORDER — ACETAMINOPHEN 325 MG PO TABS: 650.0000 mg | ORAL_TABLET | ORAL | Status: DC | PRN

## 2017-02-14 MED ORDER — ONDANSETRON HCL 4 MG/2ML IJ SOLN
INTRAMUSCULAR | Status: DC | PRN
  Administered 2017-02-14: 4 mg via INTRAVENOUS

## 2017-02-14 MED ORDER — KETOROLAC TROMETHAMINE 15 MG/ML IJ SOLN
15.0000 mg | Freq: Four times a day (QID) | INTRAMUSCULAR | Status: DC
Start: 2017-02-14 — End: 2017-02-14

## 2017-02-14 MED ORDER — PROPOFOL 10 MG/ML IV BOLUS
INTRAVENOUS | Status: AC
  Filled 2017-02-14: qty 40

## 2017-02-14 MED ORDER — MIDAZOLAM HCL 2 MG/2ML IJ SOLN
INTRAMUSCULAR | Status: AC
  Filled 2017-02-14: qty 2

## 2017-02-14 MED ORDER — ARTIFICIAL TEARS OP OINT
TOPICAL_OINTMENT | OPHTHALMIC | Status: DC | PRN
  Administered 2017-02-14: 1 via OPHTHALMIC

## 2017-02-14 MED ORDER — SUGAMMADEX SODIUM 500 MG/5ML IV SOLN
INTRAVENOUS | Status: AC
  Filled 2017-02-14: qty 5

## 2017-02-14 MED ORDER — FENTANYL CITRATE (PF) 250 MCG/5ML IJ SOLN
INTRAMUSCULAR | Status: AC
  Filled 2017-02-14: qty 5

## 2017-02-14 MED ORDER — DEXAMETHASONE SODIUM PHOSPHATE 10 MG/ML IJ SOLN
INTRAMUSCULAR | Status: DC | PRN
  Administered 2017-02-14: 10 mg via INTRAVENOUS

## 2017-02-14 MED ORDER — HYDROCORTISONE NA SUCCINATE PF 100 MG IJ SOLR
INTRAMUSCULAR | Status: AC
  Filled 2017-02-14: qty 2

## 2017-02-14 MED ORDER — LACTATED RINGERS IV SOLN
INTRAVENOUS | Status: DC | PRN
  Administered 2017-02-14: 1

## 2017-02-14 MED ORDER — HYDROMORPHONE HCL 2 MG/ML IJ SOLN: 0.2500 mg | INTRAMUSCULAR | Status: DC | PRN

## 2017-02-14 MED ORDER — PROPOFOL 10 MG/ML IV BOLUS
INTRAVENOUS | Status: DC | PRN
  Administered 2017-02-14: 40 mg via INTRAVENOUS
  Administered 2017-02-14: 160 mg via INTRAVENOUS

## 2017-02-14 MED ORDER — DEXAMETHASONE SODIUM PHOSPHATE 10 MG/ML IJ SOLN
INTRAMUSCULAR | Status: AC
  Filled 2017-02-14: qty 1

## 2017-02-14 MED ORDER — STERILE WATER FOR IRRIGATION IR SOLN
Status: DC | PRN
  Administered 2017-02-14: 1000 mL

## 2017-02-14 MED ORDER — PROMETHAZINE HCL 25 MG/ML IJ SOLN: 6.2500 mg | INTRAMUSCULAR | Status: DC | PRN

## 2017-02-14 MED ORDER — CEFAZOLIN SODIUM-DEXTROSE 2-4 GM/100ML-% IV SOLN
2.0000 g | INTRAVENOUS | Status: AC
  Administered 2017-02-14 (×2): 2 g via INTRAVENOUS
  Filled 2017-02-14: qty 100

## 2017-02-14 MED ORDER — EPHEDRINE SULFATE-NACL 50-0.9 MG/10ML-% IV SOSY
PREFILLED_SYRINGE | INTRAVENOUS | Status: DC | PRN
  Administered 2017-02-14: 20 mg via INTRAVENOUS

## 2017-02-14 MED ORDER — EPHEDRINE 5 MG/ML INJ
INTRAVENOUS | Status: AC
  Filled 2017-02-14: qty 10

## 2017-02-14 MED ORDER — PHENYLEPHRINE 40 MCG/ML (10ML) SYRINGE FOR IV PUSH (FOR BLOOD PRESSURE SUPPORT)
PREFILLED_SYRINGE | INTRAVENOUS | Status: DC | PRN
  Administered 2017-02-14: 80 ug via INTRAVENOUS

## 2017-02-14 MED ORDER — SODIUM CHLORIDE 0.9% FLUSH: 3.0000 mL | INTRAVENOUS | Status: DC | PRN

## 2017-02-14 MED ORDER — SODIUM CHLORIDE 0.9 % IV SOLN: 250.0000 mL | INTRAVENOUS | Status: DC | PRN

## 2017-02-14 MED ORDER — SUCCINYLCHOLINE CHLORIDE 200 MG/10ML IV SOSY
PREFILLED_SYRINGE | INTRAVENOUS | Status: AC
  Filled 2017-02-14: qty 10

## 2017-02-14 MED ORDER — FENTANYL CITRATE (PF) 100 MCG/2ML IJ SOLN
INTRAMUSCULAR | Status: DC | PRN
  Administered 2017-02-14 (×5): 50 ug via INTRAVENOUS

## 2017-02-14 MED ORDER — ROCURONIUM BROMIDE 10 MG/ML (PF) SYRINGE
PREFILLED_SYRINGE | INTRAVENOUS | Status: DC | PRN
  Administered 2017-02-14: 20 mg via INTRAVENOUS
  Administered 2017-02-14: 50 mg via INTRAVENOUS
  Administered 2017-02-14: 10 mg via INTRAVENOUS

## 2017-02-14 MED ORDER — HYDROCORTISONE NA SUCCINATE PF 100 MG IJ SOLR
50.0000 mg | INTRAMUSCULAR | Status: AC
  Administered 2017-02-14: 50 mg via INTRAVENOUS

## 2017-02-14 MED ORDER — TRAMADOL HCL 50 MG PO TABS: 50.0000 mg | ORAL_TABLET | Freq: Four times a day (QID) | ORAL | Status: DC

## 2017-02-14 MED ORDER — LIDOCAINE 2% (20 MG/ML) 5 ML SYRINGE
INTRAMUSCULAR | Status: AC
  Filled 2017-02-14: qty 5

## 2017-02-14 MED ORDER — LACTATED RINGERS IV SOLN
INTRAVENOUS | Status: DC
  Administered 2017-02-14 (×2): via INTRAVENOUS

## 2017-02-14 MED ORDER — ONDANSETRON HCL 4 MG/2ML IJ SOLN
INTRAMUSCULAR | Status: AC
  Filled 2017-02-14: qty 2

## 2017-02-14 MED ORDER — ACETAMINOPHEN 650 MG RE SUPP
650.0000 mg | RECTAL | Status: DC | PRN
  Filled 2017-02-14: qty 1

## 2017-02-14 MED ORDER — MIDAZOLAM HCL 5 MG/5ML IJ SOLN
INTRAMUSCULAR | Status: DC | PRN
  Administered 2017-02-14: 2 mg via INTRAVENOUS

## 2017-02-14 MED ORDER — LIDOCAINE 2% (20 MG/ML) 5 ML SYRINGE
INTRAMUSCULAR | Status: DC | PRN
  Administered 2017-02-14: 100 mg via INTRAVENOUS

## 2017-02-14 MED ORDER — PHENYLEPHRINE 40 MCG/ML (10ML) SYRINGE FOR IV PUSH (FOR BLOOD PRESSURE SUPPORT)
PREFILLED_SYRINGE | INTRAVENOUS | Status: AC
  Filled 2017-02-14: qty 10

## 2017-02-14 MED ORDER — TRAMADOL HCL 50 MG PO TABS: 50.0000 mg | ORAL_TABLET | Freq: Four times a day (QID) | ORAL | 0 refills | Status: DC | PRN

## 2017-02-14 MED ORDER — SODIUM CHLORIDE 0.9% FLUSH: 3.0000 mL | Freq: Two times a day (BID) | INTRAVENOUS | Status: DC

## 2017-02-14 MED ORDER — KETOROLAC TROMETHAMINE 30 MG/ML IJ SOLN
INTRAMUSCULAR | Status: DC | PRN
  Administered 2017-02-14: 30 mg via INTRAVENOUS

## 2017-02-14 MED ORDER — ARTIFICIAL TEARS OP OINT
TOPICAL_OINTMENT | OPHTHALMIC | Status: AC
  Filled 2017-02-14: qty 3.5

## 2017-02-14 MED ORDER — FENTANYL CITRATE (PF) 100 MCG/2ML IJ SOLN: 25.0000 ug | INTRAMUSCULAR | Status: DC | PRN

## 2017-02-14 SURGICAL SUPPLY — 70 items
APPLICATOR SURGIFLO ENDO (HEMOSTASIS) IMPLANT
BAG LAPAROSCOPIC 12 15 PORT 16 (BASKET) IMPLANT
BAG RETRIEVAL 12/15 (BASKET)
CATH ROBINSON RED A/P 16FR (CATHETERS) ×2 IMPLANT
CHLORAPREP W/TINT 26ML (MISCELLANEOUS) ×2 IMPLANT
COVER BACK TABLE 60X90IN (DRAPES) ×2 IMPLANT
COVER SURGICAL LIGHT HANDLE (MISCELLANEOUS) ×2 IMPLANT
COVER TIP SHEARS 8 DVNC (MISCELLANEOUS) ×1 IMPLANT
COVER TIP SHEARS 8MM DA VINCI (MISCELLANEOUS) ×1
DERMABOND ADVANCED (GAUZE/BANDAGES/DRESSINGS) ×1
DERMABOND ADVANCED .7 DNX12 (GAUZE/BANDAGES/DRESSINGS) ×1 IMPLANT
DRAPE ARM DVNC X/XI (DISPOSABLE) ×4 IMPLANT
DRAPE COLUMN DVNC XI (DISPOSABLE) ×1 IMPLANT
DRAPE DA VINCI XI ARM (DISPOSABLE) ×4
DRAPE DA VINCI XI COLUMN (DISPOSABLE) ×1
DRAPE SHEET LG 3/4 BI-LAMINATE (DRAPES) ×4 IMPLANT
DRAPE SURG IRRIG POUCH 19X23 (DRAPES) ×2 IMPLANT
DRSG TELFA 3X8 NADH (GAUZE/BANDAGES/DRESSINGS) ×2 IMPLANT
ELECT REM PT RETURN 15FT ADLT (MISCELLANEOUS) ×2 IMPLANT
GAUZE SPONGE 4X4 16PLY XRAY LF (GAUZE/BANDAGES/DRESSINGS) ×2 IMPLANT
GLOVE BIO SURGEON STRL SZ 6 (GLOVE) ×8 IMPLANT
GLOVE BIO SURGEON STRL SZ 6.5 (GLOVE) ×4 IMPLANT
GOWN STRL REUS W/ TWL LRG LVL3 (GOWN DISPOSABLE) ×2 IMPLANT
GOWN STRL REUS W/TWL LRG LVL3 (GOWN DISPOSABLE) ×6 IMPLANT
HOLDER FOLEY CATH W/STRAP (MISCELLANEOUS) ×2 IMPLANT
IRRIG SUCT STRYKERFLOW 2 WTIP (MISCELLANEOUS) ×2
IRRIGATION SUCT STRKRFLW 2 WTP (MISCELLANEOUS) ×1 IMPLANT
KIT BASIN OR (CUSTOM PROCEDURE TRAY) ×2 IMPLANT
KIT PROCEDURE DA VINCI SI (MISCELLANEOUS)
KIT PROCEDURE DVNC SI (MISCELLANEOUS) IMPLANT
LUBRICANT JELLY K Y 4OZ (MISCELLANEOUS) ×2 IMPLANT
MANIPULATOR UTERINE 4.5 ZUMI (MISCELLANEOUS) ×2 IMPLANT
MARKER SKIN DUAL TIP RULER LAB (MISCELLANEOUS) ×2 IMPLANT
NDL SAFETY ECLIPSE 18X1.5 (NEEDLE) ×1 IMPLANT
NEEDLE HYPO 18GX1.5 SHARP (NEEDLE) ×1
NEEDLE SPNL 18GX3.5 QUINCKE PK (NEEDLE) ×2 IMPLANT
NS IRRIG 1000ML POUR BTL (IV SOLUTION) ×2 IMPLANT
OBTURATOR OPTICAL STANDARD 8MM (TROCAR) ×1
OBTURATOR OPTICAL STND 8 DVNC (TROCAR) ×1
OBTURATOR OPTICALSTD 8 DVNC (TROCAR) ×1 IMPLANT
OCCLUDER COLPOPNEUMO (BALLOONS) ×2 IMPLANT
PACK LITHOTOMY IV (CUSTOM PROCEDURE TRAY) ×2 IMPLANT
PAD OB MATERNITY 4.3X12.25 (PERSONAL CARE ITEMS) ×2 IMPLANT
PAD POSITIONING PINK XL (MISCELLANEOUS) ×2 IMPLANT
PORT ACCESS TROCAR AIRSEAL 12 (TROCAR) ×1 IMPLANT
PORT ACCESS TROCAR AIRSEAL 5M (TROCAR) ×1
POUCH SPECIMEN RETRIEVAL 10MM (ENDOMECHANICALS) ×6 IMPLANT
SEAL CANN UNIV 5-8 DVNC XI (MISCELLANEOUS) ×4 IMPLANT
SEAL XI 5MM-8MM UNIVERSAL (MISCELLANEOUS) ×4
SET TRI-LUMEN FLTR TB AIRSEAL (TUBING) ×2 IMPLANT
SHEET LAVH (DRAPES) ×2 IMPLANT
SOLUTION ELECTROLUBE (MISCELLANEOUS) ×2 IMPLANT
SURGIFLO W/THROMBIN 8M KIT (HEMOSTASIS) IMPLANT
SUT MNCRL AB 4-0 PS2 18 (SUTURE) ×4 IMPLANT
SUT VIC AB 0 CT1 27 (SUTURE) ×1
SUT VIC AB 0 CT1 27XBRD ANTBC (SUTURE) ×1 IMPLANT
SUT VIC AB 3-0 SH 27 (SUTURE) ×1
SUT VIC AB 3-0 SH 27XBRD (SUTURE) ×1 IMPLANT
SYR 10ML LL (SYRINGE) ×2 IMPLANT
SYR 50ML LL SCALE MARK (SYRINGE) ×2 IMPLANT
TOWEL OR 17X26 10 PK STRL BLUE (TOWEL DISPOSABLE) ×4 IMPLANT
TOWEL OR NON WOVEN STRL DISP B (DISPOSABLE) ×2 IMPLANT
TRAP SPECIMEN MUCOUS 40CC (MISCELLANEOUS) IMPLANT
TRAY FOLEY W/METER SILVER 16FR (SET/KITS/TRAYS/PACK) ×2 IMPLANT
TRAY LAPAROSCOPIC (CUSTOM PROCEDURE TRAY) ×2 IMPLANT
TROCAR BLADELESS OPT 5 100 (ENDOMECHANICALS) ×2 IMPLANT
UNDERPAD 30X30 (UNDERPADS AND DIAPERS) ×2 IMPLANT
UNDERPAD 30X30 INCONTINENT (UNDERPADS AND DIAPERS) ×2 IMPLANT
WATER STERILE IRR 1500ML POUR (IV SOLUTION) ×4 IMPLANT
YANKAUER SUCT BULB TIP 10FT TU (MISCELLANEOUS) ×2 IMPLANT

## 2017-02-14 NOTE — Interval H&P Note (Signed)
History and Physical Interval Note:  02/14/2017 11:30 AM  Lauren Khan  has presented today for surgery, with the diagnosis of OLIGIO MENORRHEA  The various methods of treatment have been discussed with the patient and family. After consideration of risks, benefits and other options for treatment, the patient has consented to  Procedure(s): XI ROBOTIC ASSISTED BILATERAL SALPINGO OOPHORECTOMY WITH POSSIBLE TOTAL HYSTERECTOMY (N/A) DILATATION AND CURETTAGE (N/A) as a surgical intervention .  The patient's history has been reviewed, patient examined, no change in status, stable for surgery.  I have reviewed the patient's chart and labs.  Questions were answered to the patient's satisfaction.     [name]

## 2017-02-14 NOTE — Progress Notes (Signed)
Pt claims Penicillin allergy, rash only 15 years ago, informed pharmacy and Dr Orene Desanctis, anesthesia.

## 2017-02-14 NOTE — Discharge Instructions (Signed)
General Anesthesia, Adult, Care After These instructions provide you with information about caring for yourself after your procedure. Your health care provider may also give you more specific instructions. Your treatment has been planned according to current medical practices, but problems sometimes occur. Call your health care provider if you have any problems or questions after your procedure. What can I expect after the procedure? After the procedure, it is common to have: Vomiting. A sore throat. Mental slowness. It is common to feel: Nauseous. Cold or shivery. Sleepy. Tired. Sore or achy, even in parts of your body where you did not have surgery. Follow these instructions at home: For at least 24 hours after the procedure:  Do not: Participate in activities where you could fall or become injured. Drive. Use heavy machinery. Drink alcohol. Take sleeping pills or medicines that cause drowsiness. Make important decisions or sign legal documents. Take care of children on your own. Rest. Eating and drinking  If you vomit, drink water, juice, or soup when you can drink without vomiting. Drink enough fluid to keep your urine clear or pale yellow. Make sure you have little or no nausea before eating solid foods. Follow the diet recommended by your health care provider. General instructions  Have a responsible adult stay with you until you are awake and alert. Return to your normal activities as told by your health care provider. Ask your health care provider what activities are safe for you. Take over-the-counter and prescription medicines only as told by your health care provider. If you smoke, do not smoke without supervision. Keep all follow-up visits as told by your health care provider. This is important. Contact a health care provider if: You continue to have nausea or vomiting at home, and medicines are not helpful. You cannot drink fluids or start eating again. You cannot  urinate after 8-12 hours. You develop a skin rash. You have fever. You have increasing redness at the site of your procedure. Get help right away if: You have difficulty breathing. You have chest pain. You have unexpected bleeding. You feel that you are having a life-threatening or urgent problem. This information is not intended to replace advice given to you by your health care provider. Make sure you discuss any questions you have with your health care provider. Document Released: 01/21/2001 Document Revised: 03/19/2016 Document Reviewed: 09/29/2015 Elsevier Interactive Patient Education  2017 [employer] Dilation and Curettage or Vacuum Curettage, Care After This sheet gives you information about how to care for yourself after your procedure. Your health care provider may also give you more specific instructions. If you have problems or questions, contact your health care provider. What can I expect after the procedure? After your procedure, it is common to have:  Mild pain or cramping.  Some vaginal bleeding or spotting. These may last for up to 2 weeks after your procedure. Follow these instructions at home: Activity    Do not drive or use heavy machinery while taking prescription pain medicine.  Avoid driving for the first 24 hours after your procedure.  Take frequent, short walks, followed by rest periods, throughout the day. Ask your health care provider what activities are safe for you. After 1-2 days, you may be able to return to your normal activities.  Do not lift anything heavier than 10 lb (4.5 kg) until your health care provider approves.  For at least 2 weeks, or as long as told by your health care provider, do not:  [place].  Use tampons.  Have sexual intercourse. General instructions    Take over-the-counter and prescription medicines only as told by your health care provider. This is especially important if you take blood thinning medicine.  Do not  take baths, swim, or use a hot tub until your health care provider approves. Take showers instead of baths.  Wear compression stockings as told by your health care provider. These stockings help to prevent blood clots and reduce swelling in your legs.  It is your responsibility to get the results of your procedure. Ask your health care provider, or the department performing the procedure, when your results will be ready.  Keep all follow-up visits as told by your health care provider. This is important. Contact a health care provider if:  You have severe cramps that get worse or that do not get better with medicine.  You have severe abdominal pain.  You cannot drink fluids without vomiting.  You develop pain in a different area of your pelvis.  You have bad-smelling vaginal discharge.  You have a rash. Get help right away if:  You have vaginal bleeding that soaks more than one sanitary pad in 1 hour, for 2 hours in a row.  You pass large blood clots from your vagina.  You have a fever that is above 100.76F (38.0C).  Your abdomen feels very tender or hard.  You have chest pain.  You have shortness of breath.  You cough up blood.  You feel dizzy or light-headed.  You faint.  You have pain in your neck or shoulder area. This information is not intended to replace advice given to you by your health care provider. Make sure you discuss any questions you have with your health care provider. Document Released: 10/12/2000 Document Revised: 06/13/2016 Document Reviewed: 05/17/2016 Elsevier Interactive Patient Education  2017 Else[employer] Inc. Bilateral Salpingo-Oophorectomy, Care After Refer to this sheet in the next few weeks. These instructions provide you with information on caring for yourself after your procedure. Your health care provider may also give you more specific instructions. Your treatment has been planned according to current medical practices, but problems sometimes  occur. Call your health care provider if you have any problems or questions after your procedure. What can I expect after the procedure? After the procedure, it is typical to have the following:  Abdominal pain that can be controlled with medicine.  Vaginal spotting.  Constipation.  Menopausal symptoms such as hot flashes, vaginal dryness, and mood swings. Follow these instructions at home:  Get plenty of rest and sleep.  Only take over-the-counter or prescription medicines as directed by your health care provider. Do not take aspirin. It can cause bleeding.  Keep incision areas clean and dry. Remove or change bandages (dressings) only as directed by your health care provider.  Take showers instead of baths for a few weeks as directed by your health care provider.  Limit exercise and activities as directed by your health care provider. Do not lift anything heavier than 5 pounds (2.3 kg) until your health care provider approves.  Do not drive until your health care provider approves.  Follow your health care provider's advice regarding diet. You may be able to resume your usual diet right away.  Drink enough fluids to keep your urine clear or pale yellow.  Do not douche, use tampons, or have sexual intercourse for 6 weeks after the procedure.  Do not drink alcohol until your health care provider says it is okay.  Take your temperature twice a  day and write it down.  If you become constipated, you may:  Ask your health care provider about taking a mild laxative.  Add more fruit and bran to your diet.  Drink more fluids.  Follow up with your health care provider as directed. Contact a health care provider if:  You have swelling, redness, or increasing pain in the incision area.  You see pus coming from the incision area.  You notice a bad smell coming from the wound or dressing.  You have pain, redness, or swelling where the IV access tube was placed.  Your incision  is breaking open (the edges are not staying together).  You feel dizzy or feel like fainting.  You develop pain or bleeding when you urinate.  You develop diarrhea.  You develop nausea and vomiting.  You develop abnormal vaginal discharge.  You develop a rash.  You have pain that is not controlled with medicine. Get help right away if:  You develop a fever.  You develop abdominal pain.  You have chest pain.  You develop shortness of breath.  You pass out.  You develop pain, swelling, or redness in your leg.  You develop heavy vaginal bleeding with or without blood clots. This information is not intended to replace advice given to you by your health care provider. Make sure you discuss any questions you have with your health care provider. Document Released: 10/15/2005 Document Revised: 09/14/2016 Document Reviewed: 04/08/2013 Elsevier Interactive Patient Education  2017 [employer].

## 2017-02-14 NOTE — Anesthesia Procedure Notes (Signed)
Procedure Name: Intubation Date/Time: 02/14/2017 12:53 PM Performed by: Talbot Grumbling Pre-anesthesia Checklist: Patient identified, Emergency Drugs available, Suction available and Patient being monitored Patient Re-evaluated:Patient Re-evaluated prior to inductionOxygen Delivery Method: Circle system utilized Preoxygenation: Pre-oxygenation with 100% oxygen Intubation Type: IV induction Ventilation: Mask ventilation without difficulty and Oral airway inserted - appropriate to patient size Laryngoscope Size: Mac and 3 Grade View: Grade II Tube type: Oral Tube size: 7.5 mm Number of attempts: 1 Airway Equipment and Method: Stylet Placement Confirmation: ETT inserted through vocal cords under direct vision,  positive ETCO2 and breath sounds checked- equal and bilateral Secured at: 21 cm Tube secured with: Tape Dental Injury: Teeth and Oropharynx as per pre-operative assessment

## 2017-02-14 NOTE — Op Note (Addendum)
OPERATIVE NOTE  Date: 02/14/17  Preoperative Diagnosis: left ovarian cyst, abnormal uterine bleeding, morbid obesity (BMI 55kg/m2)  Postoperative Diagnosis:  same  Procedure(s) Performed: Robotic-assisted laparoscopic bilateral salpingo-oophorectomy, D&C  Surgeon: Everitt Amber, M.D.  Assistant Surgeon: Lahoma Crocker M.D. (an MD assistant was necessary for tissue manipulation, management of robotic instrumentation, retraction and positioning due to the complexity of the case and hospital policies).   Anesthesia: Gen. endotracheal.  Specimens: Bilateral ovaries, fallopian tubes, pelvic washings  Estimated Blood Loss: <50 mL. Blood Replacement: None  Complications: none  Indication for Procedure:  Left ovarian cyst, abnormal uterine bleeding  Operative Findings: 10cm left ovarian cyst filled with sebaceous material and hair consistent on frozen with a dermoid. Right ovarian cyst - 4 cm also consistent with a dermoid, benign endometrial curettings, extreme intraperitoneal adiposity and large abdominal pannus.  Frozen pathology was consistent with benign pathology  Procedure: The patient's taken to the operating room and placed under general endotracheal anesthesia testing difficulty. She is placed in a dorsolithotomy position and cervical acromial pad was placed. The arms were tucked with care taken to pad the olecranon process. And prepped and draped in usual sterile fashion.   First the endometrial curettings were performed. The pannus was retracted superiorally. The speculum was inserted and the cervix was grasped with a tenaculum and progressively dilated with pratts dilators. The uterus sounded to 7cm. Comprehensive curettage then took place to evacuate the uterine cavity. These were sent for frozen section. A uterine manipulator (zumi) was placed vaginally.   The abdominal pannus was then taped to bring the abdomen onto the patient's thighs. A 85mm incision was made in the left  upper quadrant palmer's point and a 5 mm Optiview trocar used to enter the abdomen under direct visualization. With entry into the abdomen and then maintenance of 15 mm of mercury the patient was placed in Trendelenburg position. An incision was made in the umbilicus and a 78GN trochar was placed through this site. Two incisions were made lateral to the umbilical incision in the left and right abdomen measuring 25mm. These incisions were made approximately 10 cm lateral to the umbilical incision. 8 mm robotic trochars were inserted. An additional left lateral 38mm robotic port was also placed. The robot was docked.  The abdomen was inspected as was the pelvis.  Pelvic washings were obtained. An incision was made on the right pelvic side wall peritoneum parallel to the IP ligament and the retroperitoneal space entered. The right ureter was identified and the para-rectal space was developed. A window was created in the right broad ligament above the ureter. The right infundibulopelvic vessels were skeletonized cauterized and transected. The utero-ovarian ligaments similarly were cauterized and transected. Specimen was placed in an Endo Catch bag.  In a similar manner the left peritoneum and the side wall was incised, and the retroperitoneal space entered. The left ureter was identified and the left pararectal space was developed. The utero-ovarian ligament was skeletonized cauterized and transected. The left utero-ovarian ligaments were cauterized and transected in the left adnexa was placed in an Endo Catch bag.  The abdomen was copiously irrigated and drained and all operative sites inspected and hemostasis was assured  The robot was undocked. The contents of the left Endo Catch bag were first aspirated and then morcellated to facilitate removal from the abdominal cavity through the LUQ incision. In a similar fashion the contents of the right Endo Catch bag or morcellated to facilitate removal from the abdominal  cavity.  The  ports were all remove. The fascial closure at the umbilical incision and left upper quadrant port was made with 0 Vicryl.  All incisions were closed with a running subcuticular Monocryl suture. Dermabond was applied. Sponge, lap and needle counts were correct x 3.    The patient had sequential compression devices for VTE prophylaxis.  30 minutes of additional time was necessary and additional OR personnel in order to facilitate patient postioning and visualization due to patient extreme obesity.         Disposition: PACU - stable         Condition: stable  [name], MD

## 2017-02-14 NOTE — Anesthesia Preprocedure Evaluation (Signed)
Anesthesia Evaluation  Patient identified by MRN, date of birth, ID band Patient awake    Reviewed: Allergy & Precautions  Airway Mallampati: I  TM Distance: >3 FB Neck ROM: Full    Dental  (+) Teeth Intact   Pulmonary asthma , former smoker,    breath sounds clear to auscultation       Cardiovascular hypertension,  Rhythm:Regular Rate:Normal     Neuro/Psych  Neuromuscular disease    GI/Hepatic hiatal hernia, GERD  ,  Endo/Other  diabetesMorbid obesity  Renal/GU      Musculoskeletal  (+) Arthritis ,   Abdominal (+) + obese,   Peds  Hematology   Anesthesia Other Findings   Reproductive/Obstetrics                             Anesthesia Physical Anesthesia Plan  ASA: II  Anesthesia Plan: General   Post-op Pain Management:    Induction: Intravenous  Airway Management Planned: Oral ETT  Additional Equipment:   Intra-op Plan:   Post-operative Plan: Extubation in OR  Informed Consent: I have reviewed the patients History and Physical, chart, labs and discussed the procedure including the risks, benefits and alternatives for the proposed anesthesia with the patient or authorized representative who has indicated his/her understanding and acceptance.   Dental advisory given  Plan Discussed with: CRNA  Anesthesia Plan Comments:         Anesthesia Quick Evaluation

## 2017-02-14 NOTE — Anesthesia Procedure Notes (Signed)
Performed by: Earlie Schank M       

## 2017-02-14 NOTE — H&P (View-Only) (Signed)
Consult Note: Gyn-Onc  Consult was requested by Dr. Alfonse Spruce for the evaluation of Lauren Khan 51 y.o. female  CC:  Chief Complaint  Patient presents with  . oligiomenorrhea    Assessment/Plan:  Ms. Lauren Khan  is a 51 y.o.  year old with a complex and enlarging 7cm left ovarian cyst and abnormal uterine bleeding in the setting of a strong family history of premenopausal breast cancer, and extreme morbid obesity (BMI 51), and chronic sterioid use. Her cervix is difficult to visualize in the office and therefore office endometrial sampling is not possible.  I am recommending surgical evaluation of the left adnexa. I suspect it is likely benign and am reassured by the normal CA 125, however, given its change in appearance and her family history, I believe pathologic confirmation is best. I discussed that surgery for Lauren Khan is associated with very high risk, particularly for risk of complications such as GI injury, or conversion to laparotomy, hernia formation, postoperative infection, readmission and or reoperation. Given that there is no apparent pre-existing uterine pathology, and given these very high risks, I recommend not performing elective hysterectomy at the time of surgery. However, we will perform a D&C and send for frozen section. If malignancy is found in either the left ovary or endometrium, we would proceed with hysterectomy during the surgery, as the benefits would balance the risks in that setting.  With respect to the right ovary we discussed observation versus elective salpingo-oophorectomy at the time of this surgery. I do not believe that BSO carries greater surgical risk for Lauren Khan than USO. I discussed that there is an increased all cause mortality associated with elective BSO in women <65 years. However, given her abnormal uterine bleeding, surgical menopause may alleviate her symptoms sooner. It would, however, be associated with abrupt onset of menopausal symptoms. Benefits would  also include prevention of ovarian cancer risk. After discussing these complex benefits and risks of performing BSO (as opposed to USO), Lauren Khan has elected to proceed with BSO at the time of surgery.   We discussed that a minimally invasive route (robotic assisted BSO) will be performed to avoid laparotomy which is associated with >50% risk for wound separation in obese diabetic patients. However, if she has severe adhesive disease from prior surgeries, or other complications are encountered, laparotomy may be necessary.   We discussed anticipated hospital stay and discharge instructions/restrictions.  Surgery, (D&C, robotic assisted BSO, possible hysterectomy) was scheduled for April 19th. We will check HbA1C as part of preop labs.  Due to her chronic hx of 44m q day of prednisone use for her arthritis, she is at increased risk for wound healing and HPA axis failure. At this dose she is at intermediate risk for HPA axis failure, and given the intermediate complexity of the procedure, should be given an empiric dosing of stress dose steroids. We will administer 50 mg hydrocortisone intravenously just before the procedure and 25 mg of hydrocortisone every eight hours for 24 hours. She will resume usual dose thereafter.  HPI: Lauren Sahagianis a 51year old G0 who is seen in consultation at the request of Dr NAlfonse Sprucefor a complex left ovarian cyst in the setting of morbid obesity (BMI 51kg/m2), diabetes mellitus and prior abdominal surgeries.  JHubertwas having an MRI of the back performed in December, 2017 for back pain work up. A left adnexal cyst was identified at that time. She was then seen by Dr NAlfonse Sprucewho performed a TVUS on 10/19/16 which identified  a uterus measuring 8.1x4.3x3.4cm with a left ovary containing a 6.7cm thin walled complex mass with a posterior mass measuring 2.8cm within it. CA 125 at that time was normal at 8.  A repeat US was performed on 12/21/16 and showed a uterus measuring  10.7x3.8x3.1cm, with an endometrial stripe measuring 50m. The left adnexal mass now measured 7.9cm with a hyperechoic posterior mass measuring 4.1cm.  The patient has no pelvic symptoms related to the cyst. However, she does report lifelong annovulatory bleeding concerns (light), and PCOS. She has a history of taking oral progestin for withdrawal bleeds.She has not yet tried a progestin releasing IUD. Her last pap in December, 2017 was ASCUS but negative for high risk HPV subtypes. She is nulliparous and with a history of primary infertility.  The patient has had a prior laparotomy for cholecystectomy and appendectomy (not ruptured) in 1990.   She has a family history significant for a maternal grandmother with breast cancer in her 410's(died), a maternal cousin who died of breast cancer at age 7641and a maternal aunt with breast cancer in her 452's The patient is unaware if any were tested for BRCA deleterious mutations. There is no family history for ovarian cancer.  Current Meds:  Outpatient Encounter Prescriptions as of 01/30/2017  Medication Sig  . Albuterol Sulfate 108 (90 Base) MCG/ACT AEPB Inhale into the lungs.  . ALPRAZolam (XANAX) 0.5 MG tablet Take 0.5 mg by mouth.  . B Complex Vitamins (VITAMIN B COMPLEX PO) Take by mouth.  . citalopram (CELEXA) 40 MG tablet TAKE ONE TABLET BY MOUTH EVERY DAY  . fluticasone (FLONASE) 50 MCG/ACT nasal spray USE TWO SPRAYS IN EACH NOSTRIL EVERY DAY  . furosemide (LASIX) 80 MG tablet TAKE ONE TABLET BY MOUTH DAILY AS NEEDED  . glipiZIDE (GLUCOTROL) 5 MG tablet TAKE ONE TABLET TWICE DAILY  . metFORMIN (GLUCOPHAGE) 1000 MG tablet Take 2,000 mg by mouth at bedtime.  .Marland Kitchennystatin cream (MYCOSTATIN) apply to affected area two times daily  . omeprazole (PRILOSEC) 20 MG capsule TAKE ONE CAPSULE TWICE DAILY  . predniSONE (DELTASONE) 10 MG tablet TAKE 4 TABLETS BY MOUTH FOR 2 DAYS, TAKE THREE TABLETS FOR 2 DAYS, TAKE TWO TABLETS FOR 2 DAYS, TAKE ONE TABLET FOR 2  DAYS   No facility-administered encounter medications on file as of 01/30/2017.     Allergy: Not on File  Social Hx:   Social History   Social History  . Marital status: Married    Spouse name: N/A  . Number of children: N/A  . Years of education: N/A   Occupational History  . Not on file.   Social History Main Topics  . Smoking status: Former Smoker    Packs/day: 1.00    Years: 5.00    Types: Cigarettes    Quit date: 09/16/1990  . Smokeless tobacco: Never Used  . Alcohol use No  . Drug use: No  . Sexual activity: Not Currently   Other Topics Concern  . Not on file   Social History Narrative  . No narrative on file    Past Surgical Hx:  Past Surgical History:  Procedure Laterality Date  . APPENDECTOMY    . CHOLECYSTECTOMY     open  . KNEE SURGERY    . TONSILLECTOMY      Past Medical Hx:  Past Medical History:  Diagnosis Date  . Anxiety   . [place]   . GERD (gastroesophageal reflux disease)   . Hypertension   .  Lumbosacral spondylosis with radiculopathy   . Morbid obesity ([place])   . PCOS (polycystic ovarian syndrome)     Past Gynecological History:  Irregular bleeding No LMP recorded.  Family Hx:  Family History  Problem Relation Age of Onset  . Cancer Maternal Aunt   . Cancer Maternal Grandmother   . Cancer Cousin     Review of Systems:  Constitutional  Feels well,    ENT Normal appearing ears and nares bilaterally Skin/Breast  No rash, sores, jaundice, itching, dryness Cardiovascular  No chest pain, shortness of breath, or edema  Pulmonary  No cough or wheeze.  Gastro Intestinal  No nausea, vomitting, or diarrhoea. No bright red blood per rectum, no abdominal pain, change in bowel movement, or constipation.  Genito Urinary  No frequency, urgency, dysuria, + abnormal uterine bleeding Musculo Skeletal  + back pain Neurologic  No weakness, numbness, change in gait,  Psychology  No depression,  anxiety, insomnia.   Vitals:  Blood pressure (!) 153/54, pulse 73, temperature 98.2 F (36.8 C), temperature source Oral, resp. rate 20, height 4' 10.74" (1.492 m), weight 262 lb 4.8 oz (119 kg).  Physical Exam: WD in NAD Neck  Supple NROM, without any enlargements.  Lymph Node Survey No cervical supraclavicular or inguinal adenopathy Cardiovascular  Pulse normal rate, regularity and rhythm. S1 and S2 normal.  Lungs  Clear to auscultation bilateraly, without wheezes/crackles/rhonchi. Good air movement.  Skin  No rash/lesions/breakdown  Psychiatry  Alert and oriented to person, place, and time  Abdomen  Normoactive bowel sounds, abdomen soft, non-tender and very obese rotund abdomen with pannus, without evidence of hernia.  Back No CVA tenderness Genito Urinary  Vulva/vagina: Normal external female genitalia.  No lesions. No discharge or bleeding.  Bladder/urethra:  No lesions or masses, well supported bladder  Vagina: long, normal, narrow  Cervix: Normal to palpate, but not visualized on speculum exam  Uterus:  Small, mobile, no parametrial involvement or nodularity.  Adnexa: no palpable masses. Rectal  deferred Extremities  No bilateral cyanosis, clubbing or edema.   [name], MD  01/31/2017, 11:40 AM

## 2017-02-14 NOTE — Transfer of Care (Signed)
Immediate Anesthesia Transfer of Care Note  Patient: Lauren Khan  Procedure(s) Performed: Procedure(s): XI ROBOTIC ASSISTED BILATERAL SALPINGO OOPHORECTOMY (N/A) DILATATION AND CURETTAGE (N/A)  Patient Location: PACU  Anesthesia Type:General  Level of Consciousness:  sedated, patient cooperative and responds to stimulation  Airway & Oxygen Therapy:Patient Spontanous Breathing and Patient connected to face mask oxgen  Post-op Assessment:  Report given to PACU RN and Post -op Vital signs reviewed and stable  Post vital signs:  Reviewed and stable  Last Vitals:  Vitals:   02/14/17 1122  BP: (!) 157/79  Pulse: 81  Resp: 18  Temp: 92.0 C    Complications: No apparent anesthesia complications

## 2017-02-15 ENCOUNTER — Encounter (HOSPITAL_COMMUNITY): Payer: Self-pay | Admitting: Gynecologic Oncology

## 2017-02-15 NOTE — Anesthesia Postprocedure Evaluation (Addendum)
Anesthesia Post Note  Patient: Lauren Khan  Procedure(s) Performed: Procedure(s) (LRB): XI ROBOTIC ASSISTED BILATERAL SALPINGO OOPHORECTOMY (N/A) DILATATION AND CURETTAGE (N/A)  Patient location during evaluation: PACU Anesthesia Type: General Level of consciousness: awake and alert Pain management: pain level controlled Vital Signs Assessment: post-procedure vital signs reviewed and stable Respiratory status: spontaneous breathing, nonlabored ventilation, respiratory function stable and patient connected to nasal cannula oxygen Cardiovascular status: blood pressure returned to baseline and stable Postop Assessment: no signs of nausea or vomiting Anesthetic complications: no       Last Vitals:  Vitals:   02/14/17 1624 02/14/17 1713  BP: (!) 116/43 (!) 165/88  Pulse: (!) 101 (!) 117  Resp: 14 16  Temp: 37.2 C 37.3 C    Last Pain:  Vitals:   02/14/17 1713  TempSrc: Oral  PainSc: 1                  Dantonio Justen,JAMES TERRILL

## 2017-02-18 ENCOUNTER — Telehealth: Payer: Self-pay | Admitting: *Deleted

## 2017-02-18 NOTE — Telephone Encounter (Signed)
Patient called regarding her post op appt. I have given her the date/time of May 14th at 3pm. Patient aware

## 2017-02-20 ENCOUNTER — Telehealth: Payer: Self-pay | Admitting: Gynecologic Oncology

## 2017-02-20 NOTE — Telephone Encounter (Signed)
Faxed completed FMLA forms to [employer] fax [phone] on 02/12/17 and scanned copy into epic under media tab

## 2017-02-25 ENCOUNTER — Telehealth: Payer: Self-pay

## 2017-02-25 NOTE — Telephone Encounter (Signed)
Called and spoke with pt regarding her pathology results. Per Dr.Rossi, results are benign and she will not need any further treatment at this time. Confirmed f/u appt with Dr.Rossi on 5/14 at 3pm. Pt verbally confirmed time/date.

## 2017-03-11 ENCOUNTER — Encounter: Payer: Self-pay | Admitting: Gynecologic Oncology

## 2017-03-11 ENCOUNTER — Ambulatory Visit: Payer: Managed Care, Other (non HMO) | Attending: Gynecologic Oncology | Admitting: Gynecologic Oncology

## 2017-03-11 VITALS — BP 167/63 | HR 78 | Temp 98.0°F | Resp 20 | Wt 268.9 lb

## 2017-03-11 DIAGNOSIS — E119 Type 2 diabetes mellitus without complications: Secondary | ICD-10-CM | POA: Diagnosis not present

## 2017-03-11 DIAGNOSIS — Z880 Allergy status to penicillin: Secondary | ICD-10-CM | POA: Diagnosis not present

## 2017-03-11 DIAGNOSIS — Z79899 Other long term (current) drug therapy: Secondary | ICD-10-CM | POA: Insufficient documentation

## 2017-03-11 DIAGNOSIS — Z90722 Acquired absence of ovaries, bilateral: Secondary | ICD-10-CM | POA: Insufficient documentation

## 2017-03-11 DIAGNOSIS — Z87891 Personal history of nicotine dependence: Secondary | ICD-10-CM | POA: Diagnosis not present

## 2017-03-11 DIAGNOSIS — Z803 Family history of malignant neoplasm of breast: Secondary | ICD-10-CM | POA: Insufficient documentation

## 2017-03-11 DIAGNOSIS — K219 Gastro-esophageal reflux disease without esophagitis: Secondary | ICD-10-CM | POA: Insufficient documentation

## 2017-03-11 DIAGNOSIS — Z6841 Body Mass Index (BMI) 40.0 and over, adult: Secondary | ICD-10-CM | POA: Insufficient documentation

## 2017-03-11 DIAGNOSIS — Z09 Encounter for follow-up examination after completed treatment for conditions other than malignant neoplasm: Secondary | ICD-10-CM | POA: Insufficient documentation

## 2017-03-11 DIAGNOSIS — D271 Benign neoplasm of left ovary: Secondary | ICD-10-CM

## 2017-03-11 DIAGNOSIS — E282 Polycystic ovarian syndrome: Secondary | ICD-10-CM | POA: Diagnosis not present

## 2017-03-11 DIAGNOSIS — D270 Benign neoplasm of right ovary: Secondary | ICD-10-CM

## 2017-03-11 DIAGNOSIS — Z885 Allergy status to narcotic agent status: Secondary | ICD-10-CM | POA: Insufficient documentation

## 2017-03-11 DIAGNOSIS — Z7984 Long term (current) use of oral hypoglycemic drugs: Secondary | ICD-10-CM | POA: Insufficient documentation

## 2017-03-11 DIAGNOSIS — N838 Other noninflammatory disorders of ovary, fallopian tube and broad ligament: Secondary | ICD-10-CM

## 2017-03-11 DIAGNOSIS — J45909 Unspecified asthma, uncomplicated: Secondary | ICD-10-CM | POA: Diagnosis not present

## 2017-03-11 NOTE — Patient Instructions (Signed)
You will need to continue to see Dr Alfonse Spruce once per year for well woman care.  You are cleared to return to work on 03/21/17.
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
## 2017-03-21 ENCOUNTER — Telehealth: Payer: Self-pay | Admitting: *Deleted

## 2017-03-21 NOTE — Telephone Encounter (Signed)
A representative from [employer] called regarding the patient's work lease. Fax a form stating that the patient is released with no restrictions.

## 2017-03-29 NOTE — Addendum Note (Signed)
Addendum  created 03/29/17 1401 by Rica Koyanagi, MD   Sign clinical note

## 2017-10-04 IMAGING — MR MR LUMBAR SPINE W/O CM
4 of 5 series · 25 of 48 positions shown · non-contrast
Comparison: None available.

CLINICAL DATA: Initial evaluation for low back pain radiating into
both legs with weakness and tingling.

EXAM:
MRI LUMBAR SPINE WITHOUT CONTRAST
TECHNIQUE: Multiplanar, multisequence MR imaging of the lumbar spine was
performed. No intravenous contrast was administered.

[Series 3: T2 · sagittal · 4.0mm · 0.55mm/px · 6 of 13 slices shown (1 of 2)]
[im 1/13]
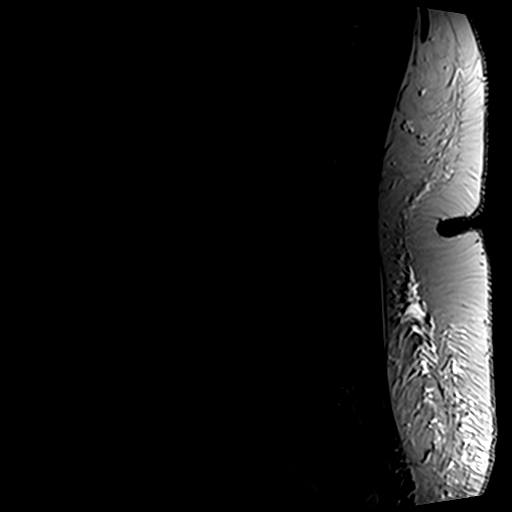
[im 3/13]
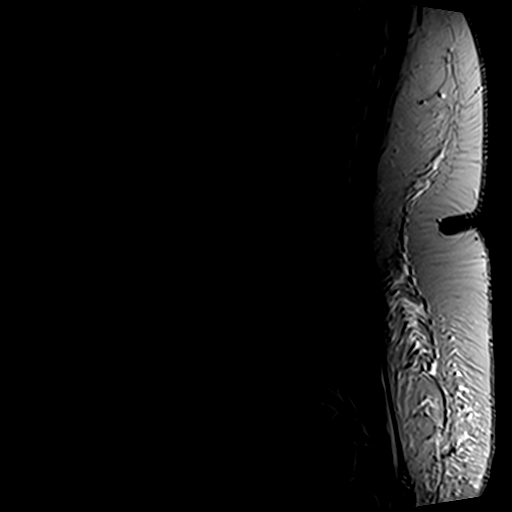
[im 5/13]
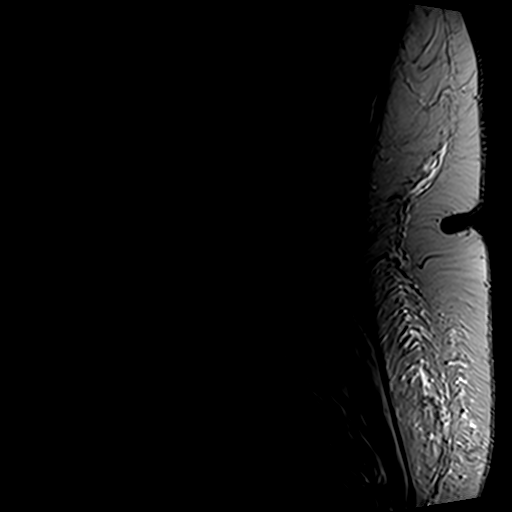
[im 8/13]
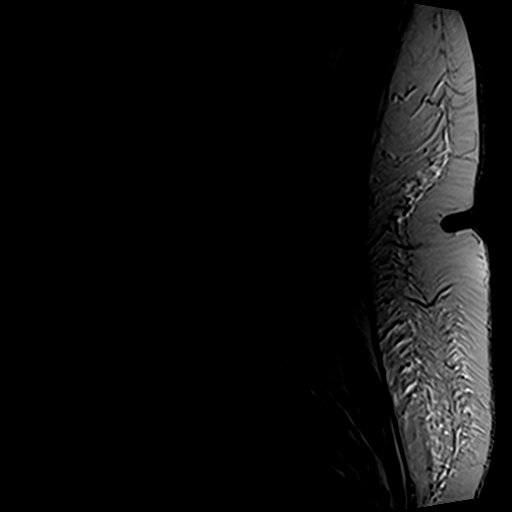
[im 10/13]
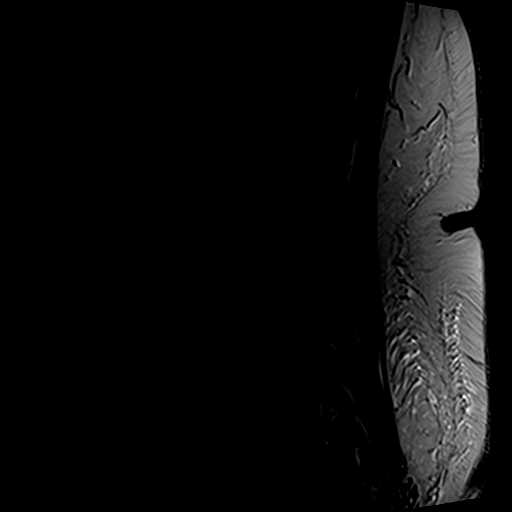
[im 13/13]
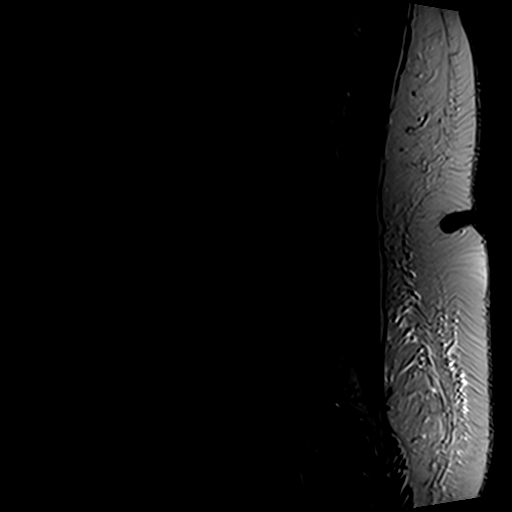

[Series 5: T1 · sagittal · 4.0mm · 0.55mm/px · 6 of 13 slices shown (1 of 2)]
[im 1/13]
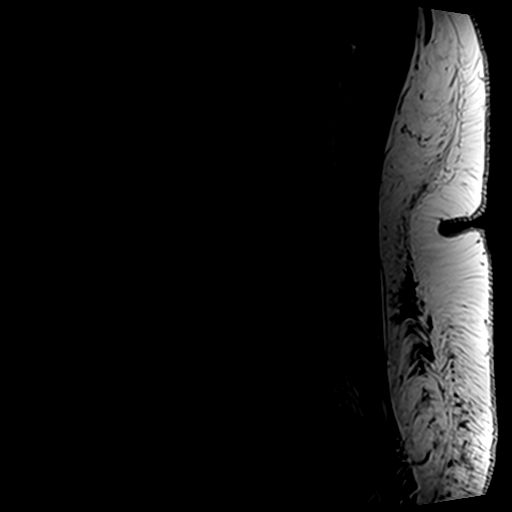
[im 3/13]
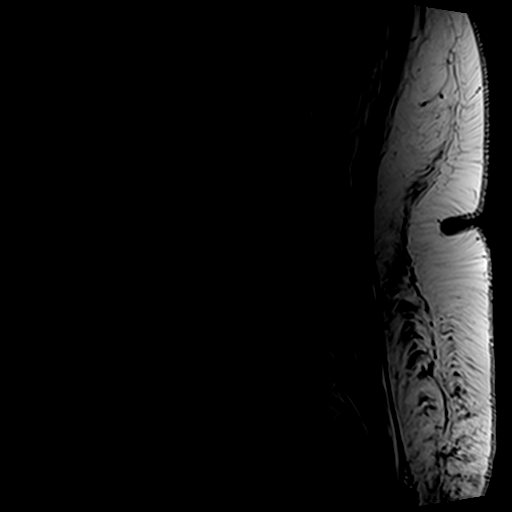
[im 5/13]
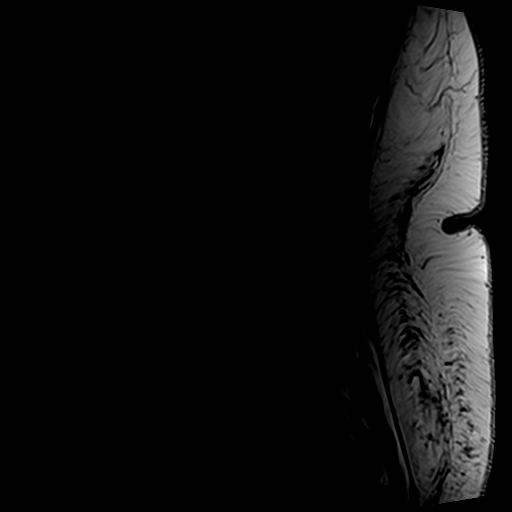
[im 8/13]
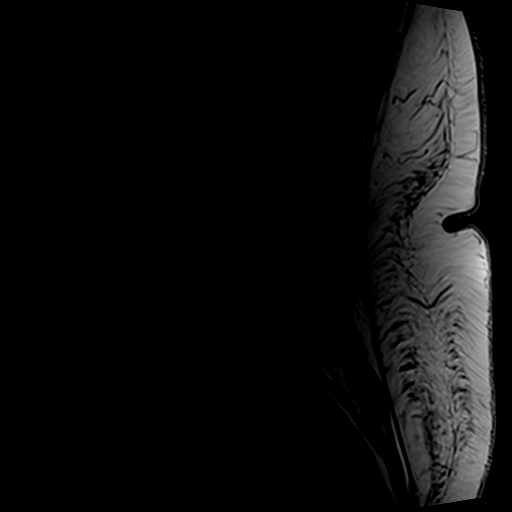
[im 10/13]
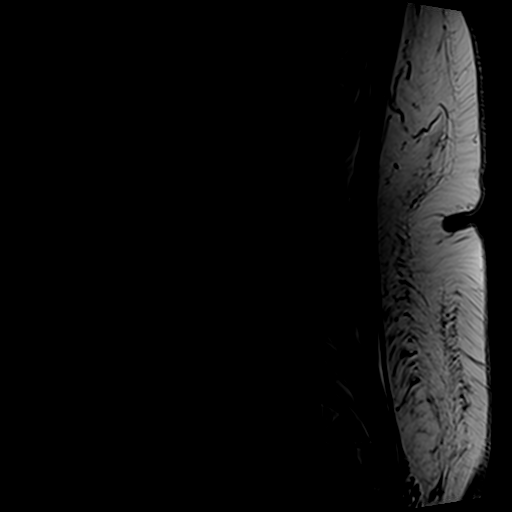
[im 13/13]
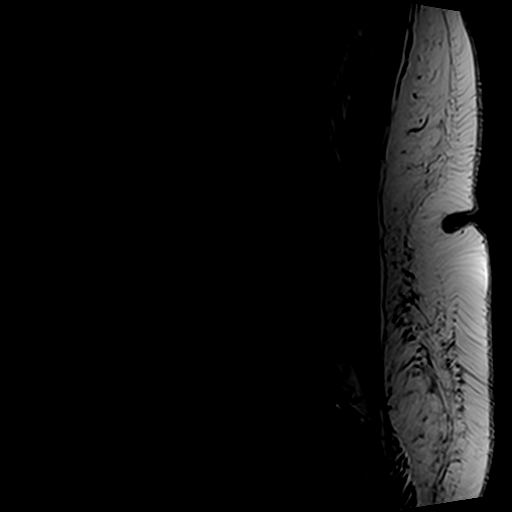

[Series 6: T2 · axial · 4.0mm · 0.78mm/px · z∈[-14,+174]mm · 9 of 33 slices shown (2 of 2)]
[im 1/33]
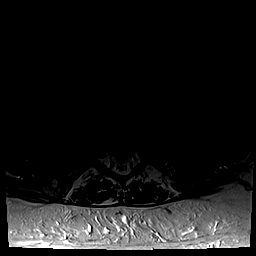
[im 5/33]
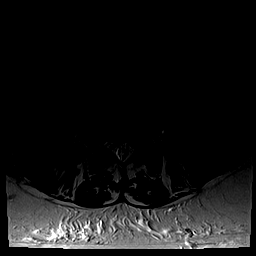
[im 10/33]
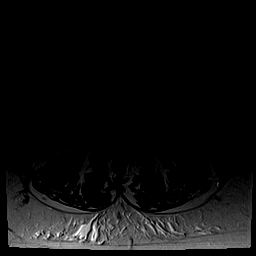
[im 14/33]
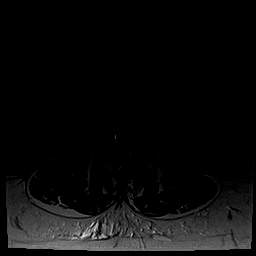
[im 17/33]
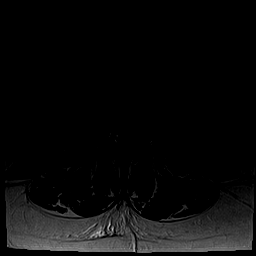
[im 19/33]
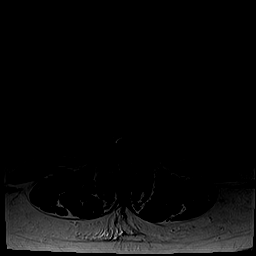
[im 23/33]
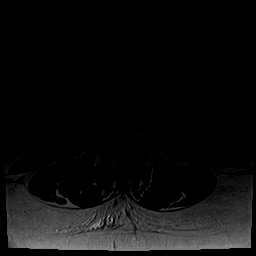
[im 28/33]
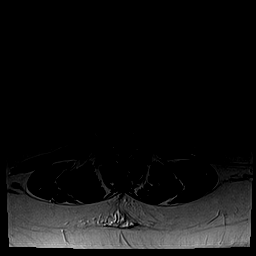
[im 33/33]
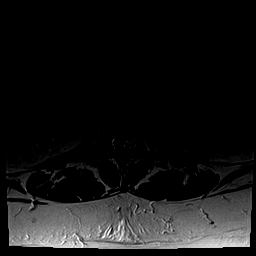

[Series 8: T1 · axial · 4.0mm · 0.39mm/px · z∈[-14,+150]mm · 4 of 33 slices shown (2 of 2)]
[im 1/33]
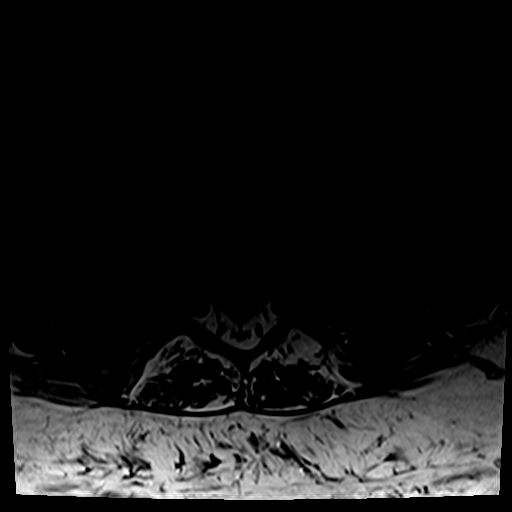
[im 5/33]
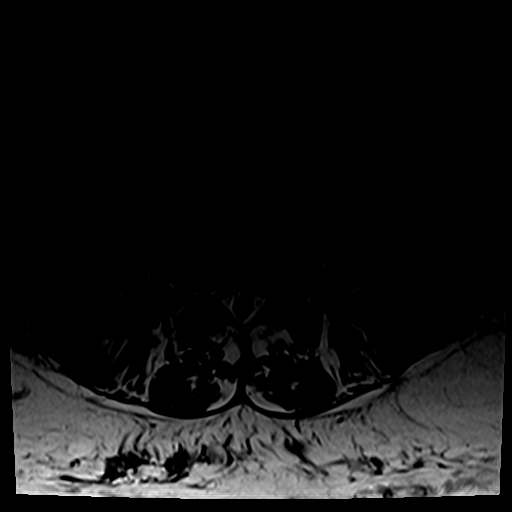
[im 17/33]
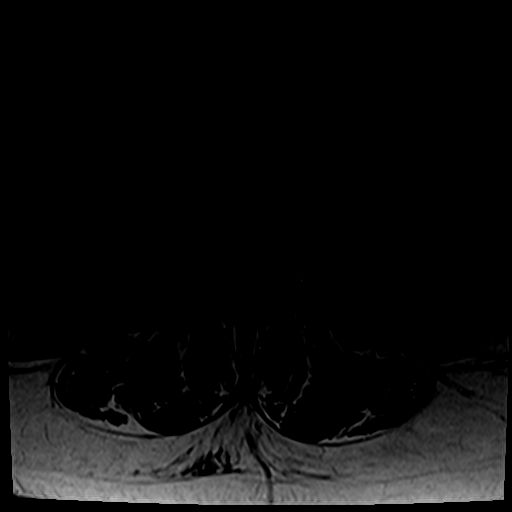
[im 28/33]
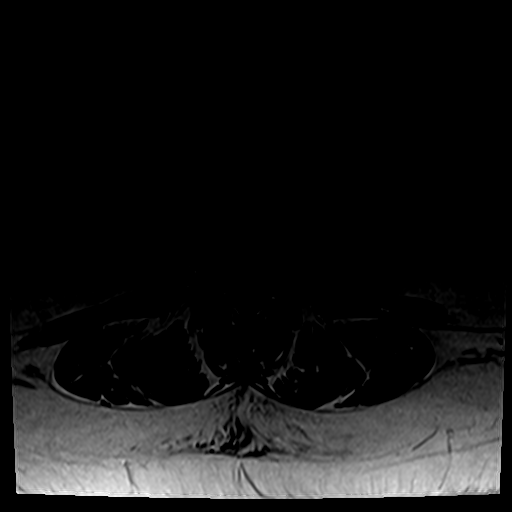

[25 of 48 positions shown; findings below may reference images not displayed]

FINDINGS: Segmentation: Normal segmentation. Lowest well-formed disc is
labeled the L5-S1 level.

Alignment: 3 mm anterolisthesis of L4 on L5. Vertebral bodies
otherwise normally aligned with preservation of the normal lumbar
lordosis.

Vertebrae: Vertebral body heights are maintained. No evidence for
acute or chronic fracture. Prominent hemangioma is noted within the
T12, L1, L3, and L4 vertebral bodies. No worrisome osseous lesions.
No abnormal marrow edema.

Conus medullaris: Extends to the L1 level and appears normal.

Paraspinal and other soft tissues: Paraspinous soft tissues
demonstrate no acute abnormality. Complex cystic structure partially
visualized within the right adnexa (series 6, image 24),
indeterminate.

Disc levels:

L1-2:  Negative.

L2-3:  Negative.

L3-4: Mild disc desiccation. No significant disc bulge. Mild
bilateral facet arthrosis. No significant canal or foraminal
stenosis.

L4-5: 3 mm anterolisthesis of L4 on L5. Associated posterior disc
bulge with disc desiccation. Moderate bilateral facet arthrosis.
There is resultant moderate to severe canal and subarticular
stenosis bilaterally. Thecal sac measures 7 mm in AP diameter. No
significant foraminal encroachment.

L5-S1:  Negative.
IMPRESSION: 1. 3 mm anterolisthesis of L4 on L5 with associated mild disc bulge
and advanced facet arthropathy, resulting in moderate to severe
canal and subarticular stenosis bilaterally.
2. Mild disc desiccation and facet arthrosis at L3-4 without
stenosis.
3. Complex cystic structure partially visualized within the left at
adnexa, indeterminate, but may reflect an ovarian cyst. Further
evaluation with dedicated pelvic ultrasound is suggested for
complete evaluation.

## 2024-04-16 ENCOUNTER — Other Ambulatory Visit (HOSPITAL_BASED_OUTPATIENT_CLINIC_OR_DEPARTMENT_OTHER): Payer: Self-pay | Admitting: Nurse Practitioner

## 2024-04-16 ENCOUNTER — Encounter (HOSPITAL_BASED_OUTPATIENT_CLINIC_OR_DEPARTMENT_OTHER): Payer: Self-pay | Admitting: Nurse Practitioner

## 2024-04-16 DIAGNOSIS — R42 Dizziness and giddiness: Secondary | ICD-10-CM

## 2024-04-24 ENCOUNTER — Other Ambulatory Visit (HOSPITAL_BASED_OUTPATIENT_CLINIC_OR_DEPARTMENT_OTHER): Payer: Self-pay | Admitting: Nurse Practitioner

## 2024-04-24 DIAGNOSIS — Z1231 Encounter for screening mammogram for malignant neoplasm of breast: Secondary | ICD-10-CM

## 2024-04-29 ENCOUNTER — Inpatient Hospital Stay (HOSPITAL_BASED_OUTPATIENT_CLINIC_OR_DEPARTMENT_OTHER)
Admission: RE | Admit: 2024-04-29 | Discharge: 2024-04-29 | Discharge status: Home / Self Care | Source: Ambulatory Visit | Attending: Nurse Practitioner | Admitting: Nurse Practitioner

## 2024-04-29 ENCOUNTER — Encounter (HOSPITAL_BASED_OUTPATIENT_CLINIC_OR_DEPARTMENT_OTHER): Payer: Self-pay | Admitting: Radiology

## 2024-04-29 ENCOUNTER — Ambulatory Visit (INDEPENDENT_AMBULATORY_CARE_PROVIDER_SITE_OTHER)
Admission: RE | Admit: 2024-04-29 | Discharge: 2024-04-29 | Disposition: A | Discharge status: Home / Self Care | Source: Ambulatory Visit | Attending: Nurse Practitioner | Admitting: Nurse Practitioner

## 2024-04-29 DIAGNOSIS — R42 Dizziness and giddiness: Secondary | ICD-10-CM

## 2024-04-29 DIAGNOSIS — Z1231 Encounter for screening mammogram for malignant neoplasm of breast: Secondary | ICD-10-CM | POA: Diagnosis not present

## 2024-04-29 LAB — I-STAT CREATININE (MANUAL ENTRY): Creatinine, Ser: 0.7 (ref 0.50–1.10)

## 2024-04-29 MED ORDER — IOHEXOL 300 MG/ML  SOLN
100.0000 mL | Freq: Once | INTRAMUSCULAR | Status: AC | PRN
  Administered 2024-04-29: 100 mL via INTRAVENOUS
